# Patient Record
Sex: Male | Born: 1960 | Race: White | Hispanic: No | Marital: Married | Smoking: Former smoker
Health system: Southern US, Community
[De-identification: ages and names within clinical notes are randomized; demographics above are authoritative.]

## PROBLEM LIST (undated history)

## (undated) DIAGNOSIS — B1910 Unspecified viral hepatitis B without hepatic coma: Secondary | ICD-10-CM

## (undated) DIAGNOSIS — E785 Hyperlipidemia, unspecified: Secondary | ICD-10-CM

## (undated) DIAGNOSIS — R0602 Shortness of breath: Secondary | ICD-10-CM

## (undated) DIAGNOSIS — M25474 Effusion, right foot: Secondary | ICD-10-CM

## (undated) DIAGNOSIS — M25472 Effusion, left ankle: Secondary | ICD-10-CM

## (undated) DIAGNOSIS — H9190 Unspecified hearing loss, unspecified ear: Secondary | ICD-10-CM

## (undated) DIAGNOSIS — M549 Dorsalgia, unspecified: Secondary | ICD-10-CM

## (undated) DIAGNOSIS — E119 Type 2 diabetes mellitus without complications: Secondary | ICD-10-CM

## (undated) DIAGNOSIS — R1310 Dysphagia, unspecified: Secondary | ICD-10-CM

## (undated) DIAGNOSIS — I10 Essential (primary) hypertension: Secondary | ICD-10-CM

## (undated) DIAGNOSIS — M25475 Effusion, left foot: Secondary | ICD-10-CM

## (undated) DIAGNOSIS — I2510 Atherosclerotic heart disease of native coronary artery without angina pectoris: Secondary | ICD-10-CM

## (undated) DIAGNOSIS — R079 Chest pain, unspecified: Secondary | ICD-10-CM

## (undated) DIAGNOSIS — N529 Male erectile dysfunction, unspecified: Secondary | ICD-10-CM

## (undated) DIAGNOSIS — R5383 Other fatigue: Secondary | ICD-10-CM

## (undated) DIAGNOSIS — M25471 Effusion, right ankle: Secondary | ICD-10-CM

## (undated) HISTORY — DX: Dorsalgia, unspecified: M54.9

## (undated) HISTORY — DX: Dysphagia, unspecified: R13.10

## (undated) HISTORY — DX: Unspecified hearing loss, unspecified ear: H91.90

## (undated) HISTORY — DX: Effusion, left foot: M25.475

## (undated) HISTORY — DX: Chest pain, unspecified: R07.9

## (undated) HISTORY — DX: Effusion, right ankle: M25.471

## (undated) HISTORY — DX: Effusion, left ankle: M25.472

## (undated) HISTORY — DX: Essential (primary) hypertension: I10

## (undated) HISTORY — DX: Male erectile dysfunction, unspecified: N52.9

## (undated) HISTORY — DX: Other fatigue: R53.83

## (undated) HISTORY — DX: Shortness of breath: R06.02

## (undated) HISTORY — PX: VASECTOMY: SHX75

## (undated) HISTORY — DX: Effusion, right ankle: M25.474

---

## 1999-06-01 ENCOUNTER — Ambulatory Visit (HOSPITAL_COMMUNITY): Admission: RE | Admit: 1999-06-01 | Discharge: 1999-06-01 | Payer: Self-pay | Admitting: *Deleted

## 1999-06-01 ENCOUNTER — Encounter: Payer: Self-pay | Admitting: *Deleted

## 2000-11-23 ENCOUNTER — Ambulatory Visit (HOSPITAL_COMMUNITY): Admission: RE | Admit: 2000-11-23 | Discharge: 2000-11-23 | Payer: Self-pay | Admitting: Gastroenterology

## 2000-11-23 ENCOUNTER — Encounter: Payer: Self-pay | Admitting: Gastroenterology

## 2002-03-23 ENCOUNTER — Emergency Department (HOSPITAL_COMMUNITY): Admission: EM | Admit: 2002-03-23 | Discharge: 2002-03-23 | Payer: Self-pay | Admitting: *Deleted

## 2002-05-15 ENCOUNTER — Encounter: Admission: RE | Admit: 2002-05-15 | Discharge: 2002-08-13 | Payer: Self-pay | Admitting: Family Medicine

## 2003-06-12 ENCOUNTER — Ambulatory Visit (HOSPITAL_BASED_OUTPATIENT_CLINIC_OR_DEPARTMENT_OTHER): Admission: RE | Admit: 2003-06-12 | Discharge: 2003-06-12 | Payer: Self-pay | Admitting: Orthopedic Surgery

## 2003-06-12 ENCOUNTER — Ambulatory Visit (HOSPITAL_COMMUNITY): Admission: RE | Admit: 2003-06-12 | Discharge: 2003-06-12 | Payer: Self-pay | Admitting: Orthopedic Surgery

## 2003-08-23 HISTORY — PX: SHOULDER OPEN ROTATOR CUFF REPAIR: SHX2407

## 2003-12-24 ENCOUNTER — Emergency Department (HOSPITAL_COMMUNITY): Admission: EM | Admit: 2003-12-24 | Discharge: 2003-12-25 | Payer: Self-pay | Admitting: Emergency Medicine

## 2004-09-29 ENCOUNTER — Ambulatory Visit (HOSPITAL_COMMUNITY): Admission: RE | Admit: 2004-09-29 | Discharge: 2004-09-29 | Payer: Self-pay | Admitting: Gastroenterology

## 2011-01-24 ENCOUNTER — Ambulatory Visit
Admission: RE | Admit: 2011-01-24 | Discharge: 2011-01-24 | Disposition: A | Discharge status: Home / Self Care | Payer: 59 | Source: Ambulatory Visit | Attending: Family Medicine | Admitting: Family Medicine

## 2011-01-24 ENCOUNTER — Other Ambulatory Visit: Payer: Self-pay | Admitting: Family Medicine

## 2011-01-24 DIAGNOSIS — J189 Pneumonia, unspecified organism: Secondary | ICD-10-CM

## 2011-05-03 ENCOUNTER — Other Ambulatory Visit: Payer: Self-pay | Admitting: Family Medicine

## 2011-05-03 DIAGNOSIS — R911 Solitary pulmonary nodule: Secondary | ICD-10-CM

## 2011-05-23 ENCOUNTER — Other Ambulatory Visit: Payer: 59

## 2012-02-09 ENCOUNTER — Emergency Department (HOSPITAL_BASED_OUTPATIENT_CLINIC_OR_DEPARTMENT_OTHER)
Admission: EM | Admit: 2012-02-09 | Discharge: 2012-02-09 | Disposition: A | Discharge status: Home / Self Care | Payer: Managed Care, Other (non HMO) | Attending: Emergency Medicine | Admitting: Emergency Medicine

## 2012-02-09 ENCOUNTER — Encounter (HOSPITAL_BASED_OUTPATIENT_CLINIC_OR_DEPARTMENT_OTHER): Payer: Self-pay | Admitting: *Deleted

## 2012-02-09 ENCOUNTER — Emergency Department (HOSPITAL_BASED_OUTPATIENT_CLINIC_OR_DEPARTMENT_OTHER): Payer: Managed Care, Other (non HMO)

## 2012-02-09 DIAGNOSIS — Z794 Long term (current) use of insulin: Secondary | ICD-10-CM | POA: Insufficient documentation

## 2012-02-09 DIAGNOSIS — M109 Gout, unspecified: Secondary | ICD-10-CM | POA: Insufficient documentation

## 2012-02-09 DIAGNOSIS — E119 Type 2 diabetes mellitus without complications: Secondary | ICD-10-CM | POA: Insufficient documentation

## 2012-02-09 LAB — GLUCOSE, CAPILLARY: Glucose-Capillary: 470 mg/dL — ABNORMAL HIGH (ref 70–99)

## 2012-02-09 MED ORDER — HYDROCODONE-ACETAMINOPHEN 5-500 MG PO TABS: 1.0000 | ORAL_TABLET | Freq: Four times a day (QID) | ORAL | Status: AC | PRN

## 2012-02-09 MED ORDER — COLCHICINE 0.6 MG PO TABS: 0.6000 mg | ORAL_TABLET | Freq: Every day | ORAL | Status: DC

## 2012-02-09 MED ORDER — INDOMETHACIN 25 MG PO CAPS: 25.0000 mg | ORAL_CAPSULE | Freq: Three times a day (TID) | ORAL | Status: DC | PRN

## 2012-02-09 NOTE — ED Provider Notes (Addendum)
History     CSN: [id number]  Arrival date & time 02/09/12  6295   First MD Initiated Contact with Patient 02/09/12 1950      Chief Complaint  Patient presents with  . Toe Pain    (Consider location/radiation/quality/duration/timing/severity/associated sxs/prior treatment) Patient is a 51 y.o. male presenting with toe pain. The history is provided by the patient.  Toe Pain This is a new (woke up this am with right great toe pain which has only started worsening.) problem. The current episode started 6 to 12 hours ago. The problem occurs constantly. The problem has been gradually worsening. The symptoms are aggravated by walking and bending. The symptoms are relieved by relaxation. He has tried rest for the symptoms. The treatment provided moderate relief.    Past Medical History  Diagnosis Date  . Diabetes mellitus     Past Surgical History  Procedure Date  . Rotator cuff repair     No family history on file.  History  Substance Use Topics  . Smoking status: Never Smoker   . Smokeless tobacco: Not on file  . Alcohol Use: No      Review of Systems  All other systems reviewed and are negative.    Allergies  Review of patient's allergies indicates no known allergies.  Home Medications   Current Outpatient Rx  Name Route Sig Dispense Refill  . GEMFIBROZIL 600 MG PO TABS Oral Take 600 mg by mouth 2 (two) times daily.    . INSULIN DETEMIR 100 UNIT/ML [place] SOLN Subcutaneous Inject 10 Units into the skin every morning.    Marland Kitchen METFORMIN HCL 500 MG PO TABS Oral Take 500 mg by mouth 2 (two) times daily.    . ADULT MULTIVITAMIN W/MINERALS CH Oral Take 1 tablet by mouth daily.    Marland Kitchen SIMVASTATIN 40 MG PO TABS Oral Take 40 mg by mouth daily.      BP 153/87  Pulse 102  Temp 98.7 F (37.1 C) (Oral)  Resp 20  SpO2 98%  Physical Exam  Constitutional: He is oriented to person, place, and time. He appears well-developed and well-nourished. No distress.  HENT:  Head:  Normocephalic and atraumatic.  Musculoskeletal: He exhibits tenderness.       Right foot: He exhibits bony tenderness and swelling. He exhibits normal capillary refill and no deformity.       Feet:  Neurological: He is alert and oriented to person, place, and time. He has normal strength. No sensory deficit.  Skin: Skin is warm and dry.    ED Course  Procedures (including critical care time)  Labs Reviewed - No data to display Dg Toe Great Right  02/09/2012  *RADIOLOGY REPORT*  Clinical Data: Great toe pain and tenderness.  RIGHT GREAT TOE  Comparison: None.  Findings: No evidence of fracture or dislocation.  No evidence of osteolysis or periosteal reaction.  No evidence of arthropathy or other significant bone abnormality.  No evidence of radiopaque foreign body or soft tissue gas.  IMPRESSION: Negative.  Original Report Authenticated By: Danae Orleans, M.D.     1. Gout       MDM   Patient with symptoms most consistent for gout. He states he woke up this morning with pain in his right great toe which is only escalated throughout the day. On exam the patient has a warm, erythematous, tender right MTP and great toe. There is no sign of diabetic foot ulcers. He states he's never had any kidney problems he  had normal lab tests other than his blood sugar a month ago at his doctor's appointment. He denies any systemic symptoms or infectious etiology. Plain film is negative and he denies trauma. Patient treated for gout.        Fernando Sprout, MD 02/09/12 2018  Fernando Sprout, MD 02/09/12 2039

## 2012-02-09 NOTE — ED Notes (Signed)
Woke this am with right great toe pain. No known injury. Pt is a diabetic.

## 2012-02-09 NOTE — ED Notes (Signed)
Patient transported to X-ray 

## 2012-03-10 ENCOUNTER — Encounter: Payer: Self-pay | Admitting: Cardiovascular Disease

## 2012-03-10 ENCOUNTER — Encounter: Payer: Self-pay | Admitting: Nurse Practitioner

## 2012-03-22 ENCOUNTER — Encounter: Payer: Self-pay | Admitting: Cardiovascular Disease

## 2012-03-22 ENCOUNTER — Ambulatory Visit (INDEPENDENT_AMBULATORY_CARE_PROVIDER_SITE_OTHER): Payer: Managed Care, Other (non HMO) | Admitting: Cardiovascular Disease

## 2012-03-22 ENCOUNTER — Inpatient Hospital Stay (HOSPITAL_COMMUNITY)
Admission: AD | Admit: 2012-03-22 | Discharge: 2012-03-24 | DRG: 247 | Disposition: A | Discharge status: Home / Self Care | Payer: Managed Care, Other (non HMO) | Source: Ambulatory Visit | Attending: Cardiovascular Disease | Admitting: Cardiovascular Disease

## 2012-03-22 VITALS — BP 131/83 | HR 100 | Ht 67.0 in | Wt 206.4 lb

## 2012-03-22 DIAGNOSIS — E785 Hyperlipidemia, unspecified: Secondary | ICD-10-CM | POA: Insufficient documentation

## 2012-03-22 DIAGNOSIS — I200 Unstable angina: Secondary | ICD-10-CM

## 2012-03-22 DIAGNOSIS — J45909 Unspecified asthma, uncomplicated: Secondary | ICD-10-CM | POA: Diagnosis present

## 2012-03-22 DIAGNOSIS — IMO0001 Reserved for inherently not codable concepts without codable children: Secondary | ICD-10-CM | POA: Diagnosis present

## 2012-03-22 DIAGNOSIS — Z794 Long term (current) use of insulin: Secondary | ICD-10-CM

## 2012-03-22 DIAGNOSIS — I2510 Atherosclerotic heart disease of native coronary artery without angina pectoris: Secondary | ICD-10-CM | POA: Insufficient documentation

## 2012-03-22 DIAGNOSIS — E781 Pure hyperglyceridemia: Secondary | ICD-10-CM | POA: Diagnosis present

## 2012-03-22 DIAGNOSIS — E119 Type 2 diabetes mellitus without complications: Secondary | ICD-10-CM

## 2012-03-22 DIAGNOSIS — R000 Tachycardia, unspecified: Secondary | ICD-10-CM | POA: Diagnosis present

## 2012-03-22 DIAGNOSIS — Z79899 Other long term (current) drug therapy: Secondary | ICD-10-CM

## 2012-03-22 HISTORY — DX: Unspecified viral hepatitis B without hepatic coma: B19.10

## 2012-03-22 HISTORY — DX: Atherosclerotic heart disease of native coronary artery without angina pectoris: I25.10

## 2012-03-22 HISTORY — DX: Type 2 diabetes mellitus without complications: E11.9

## 2012-03-22 HISTORY — DX: Hyperlipidemia, unspecified: E78.5

## 2012-03-22 LAB — COMPREHENSIVE METABOLIC PANEL
ALT: 17 U/L (ref 0–53)
AST: 16 U/L (ref 0–37)
CO2: 23 mEq/L (ref 19–32)
Chloride: 100 mEq/L (ref 96–112)
GFR calc non Af Amer: >90 mL/min (ref >90)
Sodium: 137 mEq/L (ref 135–145)
Total Bilirubin: 0.2 mg/dL — ABNORMAL LOW (ref 0.3–1.2)

## 2012-03-22 LAB — CBC WITH DIFFERENTIAL/PLATELET
Lymphocytes Relative: 52 % — ABNORMAL HIGH (ref 12–46)
Lymphs Abs: 5.8 10*3/uL — ABNORMAL HIGH (ref 0.7–4.0)
MCV: 84.5 fL (ref 78.0–100.0)
Neutrophils Relative %: 41 % — ABNORMAL LOW (ref 43–77)
Platelets: 257 10*3/uL (ref 150–400)
RBC: 5.34 MIL/uL (ref 4.22–5.81)
WBC: 11.2 10*3/uL — ABNORMAL HIGH (ref 4.0–10.5)

## 2012-03-22 LAB — CARDIAC PANEL(CRET KIN+CKTOT+MB+TROPI): CK, MB: 1.3 ng/mL (ref 0.3–4.0)

## 2012-03-22 LAB — PROTIME-INR: Prothrombin Time: 13.5 seconds (ref 11.6–15.2)

## 2012-03-22 LAB — MRSA PCR SCREENING: MRSA by PCR: NEGATIVE

## 2012-03-22 MED ORDER — ONDANSETRON HCL 4 MG/2ML IJ SOLN: 4.0000 mg | Freq: Four times a day (QID) | INTRAMUSCULAR | Status: DC | PRN

## 2012-03-22 MED ORDER — METOPROLOL TARTRATE 25 MG PO TABS
25.0000 mg | ORAL_TABLET | Freq: Two times a day (BID) | ORAL | Status: DC
  Administered 2012-03-22 – 2012-03-24 (×3): 25 mg via ORAL
  Filled 2012-03-22 (×5): qty 1

## 2012-03-22 MED ORDER — INSULIN DETEMIR 100 UNIT/ML ~~LOC~~ SOLN
26.0000 [IU] | Freq: Every day | SUBCUTANEOUS | Status: DC
  Filled 2012-03-22: qty 10

## 2012-03-22 MED ORDER — INSULIN NPH (HUMAN) (ISOPHANE) 100 UNIT/ML ~~LOC~~ SUSP
10.0000 [IU] | Freq: Two times a day (BID) | SUBCUTANEOUS | Status: DC
  Filled 2012-03-22: qty 10

## 2012-03-22 MED ORDER — SUCRALFATE 1 GM/10ML PO SUSP
1.0000 g | Freq: Four times a day (QID) | ORAL | Status: DC
  Filled 2012-03-22 (×2): qty 10

## 2012-03-22 MED ORDER — INDOMETHACIN 25 MG PO CAPS
25.0000 mg | ORAL_CAPSULE | Freq: Three times a day (TID) | ORAL | Status: DC | PRN
  Filled 2012-03-22: qty 1

## 2012-03-22 MED ORDER — ATORVASTATIN CALCIUM 80 MG PO TABS
80.0000 mg | ORAL_TABLET | Freq: Every day | ORAL | Status: DC
  Administered 2012-03-22 – 2012-03-24 (×2): 80 mg via ORAL
  Filled 2012-03-22 (×3): qty 1

## 2012-03-22 MED ORDER — COLCHICINE 0.6 MG PO TABS: 0.6000 mg | ORAL_TABLET | Freq: Every day | ORAL | Status: DC

## 2012-03-22 MED ORDER — ACETAMINOPHEN 325 MG PO TABS: 650.0000 mg | ORAL_TABLET | ORAL | Status: DC | PRN

## 2012-03-22 MED ORDER — SODIUM CHLORIDE 0.9 % IJ SOLN
3.0000 mL | Freq: Two times a day (BID) | INTRAMUSCULAR | Status: DC
  Administered 2012-03-22: 3 mL via INTRAVENOUS
  Administered 2012-03-23: 09:00:00 via INTRAVENOUS

## 2012-03-22 MED ORDER — INSULIN ASPART 100 UNIT/ML ~~LOC~~ SOLN
0.0000 [IU] | Freq: Three times a day (TID) | SUBCUTANEOUS | Status: DC
  Administered 2012-03-23: 18:00:00 3 [IU] via SUBCUTANEOUS
  Administered 2012-03-24: 11 [IU] via SUBCUTANEOUS

## 2012-03-22 MED ORDER — SODIUM CHLORIDE 0.9 % IV SOLN: 1.0000 mL/kg/h | INTRAVENOUS | Status: DC

## 2012-03-22 MED ORDER — ASPIRIN 81 MG PO CHEW
324.0000 mg | CHEWABLE_TABLET | ORAL | Status: AC
  Administered 2012-03-23: 324 mg via ORAL
  Filled 2012-03-22: qty 4

## 2012-03-22 MED ORDER — DIAZEPAM 5 MG PO TABS
5.0000 mg | ORAL_TABLET | ORAL | Status: AC
  Administered 2012-03-23: 5 mg via ORAL
  Filled 2012-03-22: qty 1

## 2012-03-22 MED ORDER — HEPARIN BOLUS VIA INFUSION
4000.0000 [IU] | Freq: Once | INTRAVENOUS | Status: AC
  Administered 2012-03-22: 4000 [IU] via INTRAVENOUS
  Filled 2012-03-22: qty 4000

## 2012-03-22 MED ORDER — NITROGLYCERIN 0.4 MG SL SUBL: 0.4000 mg | SUBLINGUAL_TABLET | SUBLINGUAL | Status: DC | PRN

## 2012-03-22 MED ORDER — INSULIN ASPART 100 UNIT/ML ~~LOC~~ SOLN
0.0000 [IU] | Freq: Every day | SUBCUTANEOUS | Status: DC
  Administered 2012-03-22: 2 [IU] via SUBCUTANEOUS

## 2012-03-22 MED ORDER — SODIUM CHLORIDE 0.9 % IJ SOLN: 3.0000 mL | INTRAMUSCULAR | Status: DC | PRN

## 2012-03-22 MED ORDER — DIAZEPAM 5 MG PO TABS: 5.0000 mg | ORAL_TABLET | Freq: Four times a day (QID) | ORAL | Status: DC | PRN

## 2012-03-22 MED ORDER — NITROGLYCERIN IN D5W 200-5 MCG/ML-% IV SOLN
3.0000 ug/min | INTRAVENOUS | Status: DC
  Administered 2012-03-22: 5 ug/min via INTRAVENOUS
  Filled 2012-03-22: qty 250

## 2012-03-22 MED ORDER — ADULT MULTIVITAMIN W/MINERALS CH
1.0000 | ORAL_TABLET | Freq: Every day | ORAL | Status: DC
  Administered 2012-03-23 – 2012-03-24 (×2): 1 via ORAL
  Filled 2012-03-22 (×2): qty 1

## 2012-03-22 MED ORDER — SODIUM CHLORIDE 0.9 % IV SOLN: 250.0000 mL | INTRAVENOUS | Status: DC | PRN

## 2012-03-22 MED ORDER — HYDROCODONE-ACETAMINOPHEN 5-500 MG PO TABS: 1.0000 | ORAL_TABLET | Freq: Four times a day (QID) | ORAL | Status: DC | PRN

## 2012-03-22 MED ORDER — HYDROCODONE-ACETAMINOPHEN 5-325 MG PO TABS: 1.0000 | ORAL_TABLET | Freq: Four times a day (QID) | ORAL | Status: DC | PRN

## 2012-03-22 MED ORDER — ASPIRIN 81 MG PO CHEW
324.0000 mg | CHEWABLE_TABLET | ORAL | Status: AC
  Administered 2012-03-22: 324 mg via ORAL
  Filled 2012-03-22: qty 4

## 2012-03-22 MED ORDER — INSULIN GLARGINE 100 UNIT/ML ~~LOC~~ SOLN
13.0000 [IU] | Freq: Once | SUBCUTANEOUS | Status: AC
  Administered 2012-03-22: 13 [IU] via SUBCUTANEOUS

## 2012-03-22 MED ORDER — SODIUM CHLORIDE 0.9 % IV SOLN
INTRAVENOUS | Status: DC
  Administered 2012-03-22: 18:00:00 via INTRAVENOUS

## 2012-03-22 MED ORDER — ASPIRIN EC 81 MG PO TBEC: 81.0000 mg | DELAYED_RELEASE_TABLET | Freq: Every day | ORAL | Status: DC

## 2012-03-22 MED ORDER — ASPIRIN EC 81 MG PO TBEC
81.0000 mg | DELAYED_RELEASE_TABLET | Freq: Every day | ORAL | Status: DC
  Administered 2012-03-24: 81 mg via ORAL
  Filled 2012-03-22: qty 1

## 2012-03-22 MED ORDER — ASPIRIN 300 MG RE SUPP
300.0000 mg | RECTAL | Status: AC
  Filled 2012-03-22: qty 1

## 2012-03-22 MED ORDER — HEPARIN (PORCINE) IN NACL 100-0.45 UNIT/ML-% IJ SOLN
1550.0000 [IU]/h | INTRAMUSCULAR | Status: DC
  Administered 2012-03-22: 1200 [IU]/h via INTRAVENOUS
  Administered 2012-03-23 (×2): 1550 [IU]/h via INTRAVENOUS
  Filled 2012-03-22 (×3): qty 250

## 2012-03-22 MED ORDER — FENOFIBRATE 160 MG PO TABS
160.0000 mg | ORAL_TABLET | Freq: Every day | ORAL | Status: DC
  Administered 2012-03-22 – 2012-03-24 (×3): 160 mg via ORAL
  Filled 2012-03-22 (×3): qty 1

## 2012-03-22 NOTE — Progress Notes (Signed)
Fernando Nelson Date of Birth  December 15, 1960       [facility]    [place] [address]  [address] [place], [place]  [address]   [place], [place]  [address] [phone]     [phone]   Fax  [phone]    Fax [phone]  Problem List: 1. Chest pain 2. Diabetes Mellitus 3. Hyperlipidemia / hypertriglyceridemia   History of Present Illness:  Fernando Nelson is a 51 yo with a recent episode of CP.  He was admitted to [facility] with CP and markedly elevated glucose levels and triglyceride levels.  He ruled out for MI.  No stress testing was done.  Since that time 03/09/12 He describes a recurrent  chest heaviness - primarily in the center of his chest with radiation to his jaw and down his left arm.  These last 5-10 minutes.  These pains are heavy.   He was well / pain free up until he presented to the hospital on 719. Since that time he's had recurrent episodes of this chest heaviness. The pains are rate related from a 3/10 to as much as an 8/10. Occur with rest and with exertion. He's continued to have these severe pains.     Current Outpatient Prescriptions on File Prior to Visit  Medication Sig Dispense Refill  . colchicine 0.6 MG tablet Take 1 tablet (0.6 mg total) by mouth daily.  10 tablet  0  . gemfibrozil (LOPID) 600 MG tablet Take 600 mg by mouth 2 (two) times daily.      . indomethacin (INDOCIN) 25 MG capsule Take 1 capsule (25 mg total) by mouth 3 (three) times daily as needed.  21 capsule  0  . insulin detemir (LEVEMIR FLEXPEN) 100 UNIT/ML injection Inject 26 Units into the skin daily.       . Insulin Isophane Human (NOVOLIN N [place]) Inject 3 Units into the skin.      . metFORMIN (GLUCOPHAGE) 500 MG tablet Take 1,000 mg by mouth 2 (two) times daily.       . Multiple Vitamin (MULTIVITAMIN WITH MINERALS) TABS Take 1 tablet by mouth daily.      . simvastatin (ZOCOR) 40 MG tablet Take 40 mg by mouth daily.      . sucralfate (CARAFATE)  1 GM/10ML suspension Take 1 g by mouth 4 (four) times daily.        No Known Allergies  Past Medical History  Diagnosis Date  . Diabetes mellitus     Past Surgical History  Procedure Date  . Rotator cuff repair     History  Smoking status  . Never Smoker   Smokeless tobacco  . Not on file    History  Alcohol Use No    Family History  Problem Relation Age of Onset  . Heart failure Mother     Reviw of Systems:  Reviewed in the HPI.  All other systems are negative.  Physical Exam: Blood pressure 131/83, pulse 100, height 5\' 7"  (1.702 m), weight 206 lb 6.4 oz (93.622 kg). General: Well developed, well nourished, in no acute distress.  Head: Normocephalic, atraumatic, sclera non-icteric, mucus membranes are moist,   Neck: Supple. Carotids are 2 + without bruits. No JVD  Lungs: Clear bilaterally to auscultation.  Heart: regular rate.  normal  S1 S2. No murmurs, gallops or rubs.  Abdomen: Soft, non-tender, non-distended with normal bowel sounds. No hepatomegaly. No rebound/guarding. No masses.  Msk:  Strength and tone are normal  Extremities: No clubbing or cyanosis. No edema.  Distal pedal pulses are 2+ and equal bilaterally.  Neuro: Alert and oriented X 3. Moves all extremities spontaneously.  Psych:  Responds to questions appropriately with a normal affect.  ECG: March 22, 2012 - Sinus tachycardia at 101.  ST depression /T wave inversion inferiorly with T wave flattening in the lateral leads.  These ST segment depressions and T-wave inversions are new from his EKG last week at [facility].  Assessment / Plan:

## 2012-03-22 NOTE — Assessment & Plan Note (Signed)
Fernando Nelson presents with symptoms that are consistent with unstable angina. He was completely pain-free until July 19. He was admitted to the hospital at [facility]. He ruled out for myocardial infarction. He had very minimal ST segment elevation there. He's had recurrent episodes of chest pain since that time. He has up to 12 episodes a day. These are described as a heavy chest pressure with radiation up into his jaw and down into his left arm.   These Occur with rest and exertion.  Because of his ongoing symptoms of rest angina and his EKG that is suggestive of inferior wall ischemia I think that he needs to be admitted to the hospital. [facility] admit him to a step down bed and start him on IV heparin and IV nitroglycerin.  He has numerous risk factors for coronary artery disease including poorly controlled diabetes mellitus and poorly controlled hypertriglyceridemia.

## 2012-03-22 NOTE — Patient Instructions (Addendum)
GO TO ADMITTING AT MAIN ENTRANCE OF [facility]   ROOM WILL BE ON 2900

## 2012-03-22 NOTE — Progress Notes (Signed)
Pt is feeling better - currently in 2917.  Just got the heparin and IV NTG started.    Changed his diet to carb modified. He is still tachycardic.will add metoprolol 25 mg BID.  For left heart cath with possible PCI in AM.  Alvia Grove., MD, [facility] 03/22/2012, 7:38 PM Office - [phone] Pager [phone]

## 2012-03-22 NOTE — Assessment & Plan Note (Signed)
His glucose levels have been very elevated. In the progressive control of his diabetes.

## 2012-03-22 NOTE — Progress Notes (Addendum)
ANTICOAGULATION CONSULT NOTE - Initial Consult  Pharmacy Consult for Heparin Indication: unstable angina  No Known Allergies  Patient Measurements: Height: 5\' 7"  (170.2 cm) Weight: 206 lb 6.4 oz (93.622 kg) IBW/kg (Calculated) : 66.1  Heparin Dosing Weight: 86 kg  Vital Signs: Temp: 99.2 F (37.3 C) (08/01 1727) Temp src: Oral (08/01 1727) BP: 131/83 mmHg (08/01 1557) Pulse Rate: 100  (08/01 1557)  Labs: No results found for this basename: HGB:2,HCT:3,PLT:3,APTT:3,LABPROT:3,INR:3,HEPARINUNFRC:3,CREATININE:3,CKTOTAL:3,CKMB:3,TROPONINI:3 in the last 72 hours  CrCl is unknown because no creatinine reading has been taken.   Medical History: Past Medical History  Diagnosis Date  . Diabetes mellitus     Medications:  Prescriptions prior to admission  Medication Sig Dispense Refill  . colchicine 0.6 MG tablet Take 1 tablet (0.6 mg total) by mouth daily.  10 tablet  0  . diazepam (VALIUM) 5 MG tablet Take 5 mg by mouth every 6 (six) hours as needed.      Marland Kitchen gemfibrozil (LOPID) 600 MG tablet Take 600 mg by mouth 2 (two) times daily.      . Hydrocodone-Acetaminophen (VICODIN PO) Take 500 mg by mouth.      . indomethacin (INDOCIN) 25 MG capsule Take 1 capsule (25 mg total) by mouth 3 (three) times daily as needed.  21 capsule  0  . insulin detemir (LEVEMIR FLEXPEN) 100 UNIT/ML injection Inject 26 Units into the skin daily.       . Insulin Isophane Human (NOVOLIN N [place]) Inject 3 Units into the skin.      . metFORMIN (GLUCOPHAGE) 500 MG tablet Take 1,000 mg by mouth 2 (two) times daily.       . Multiple Vitamin (MULTIVITAMIN WITH MINERALS) TABS Take 1 tablet by mouth daily.      . simvastatin (ZOCOR) 40 MG tablet Take 40 mg by mouth daily.      . sucralfate (CARAFATE) 1 GM/10ML suspension Take 1 g by mouth 4 (four) times daily.        Assessment: 51 yo male admitted with chest pain concerning for unstable angina. Patient has had chest pain since admission to [facility] on 7/19 where  he ruled out for MI. Pharmacy consulted to begin heparin.  Labs have been drawn but are in process. I spoke with Mr. Swaim and he reports no history of bleeding or kidney disease.  Goal of Therapy:  Heparin level 0.3-0.7 units/ml Monitor platelets by anticoagulation protocol: Yes   Plan:  -Heparin 4000 units IV bolus then infuse at 1200 units/hr -Heparin level 6 hours after started -Daily heparin level and CBC while on heparin -Follow-up baseline labs   [facility], [place].D., BCPS Clinical Pharmacist Pager: [phone] 03/22/2012 5:43 PM   Addendum: Baseline labs including CBC and SCr are wnl. First troponin is negative.  [place], [address].D., BCPS Clinical Pharmacist Pager: [phone] 03/22/2012 7:08 PM

## 2012-03-22 NOTE — Assessment & Plan Note (Signed)
His triglyceride levels have been over thousand for several years. We will need to discontinue the Lopid and start him on high lipids. In addition I never give Lopid in combination with simvastatin.  We talked about a ultra-low-fat diet and the role of exercise and this.

## 2012-03-22 NOTE — H&P (Signed)
Elyn Aquas., MD 03/22/2012 5:32 PM Signed  [name]  Date of Birth 1961-03-26  [facility] [place]  [address], [address]  [place], [place] [address] [place], [place] [address]  [phone]  Fax [phone] Fax [phone]  Problem List:  1. Chest pain  2. Diabetes Mellitus  3. Hyperlipidemia / hypertriglyceridemia  History of Present Illness:  Fernando Nelson is a 51 yo with a recent episode of CP. He was admitted to [facility] with CP and markedly elevated glucose levels and triglyceride levels. He ruled out for MI. No stress testing was done.  Since that time 03/09/12 He describes a recurrent chest heaviness - primarily in the center of his chest with radiation to his jaw and down his left arm. These last 5-10 minutes. These pains are heavy.  He was well / pain free up until he presented to the hospital on 719. Since that time he's had recurrent episodes of this chest heaviness. The pains are rate related from a 3/10 to as much as an 8/10. Occur with rest and with exertion. He's continued to have these severe pains.  Current Outpatient Prescriptions on File Prior to Visit   Medication  Sig  Dispense  Refill   .  colchicine 0.6 MG tablet  Take 1 tablet (0.6 mg total) by mouth daily.  10 tablet  0   .  gemfibrozil (LOPID) 600 MG tablet  Take 600 mg by mouth 2 (two) times daily.     .  indomethacin (INDOCIN) 25 MG capsule  Take 1 capsule (25 mg total) by mouth 3 (three) times daily as needed.  21 capsule  0   .  insulin detemir (LEVEMIR FLEXPEN) 100 UNIT/ML injection  Inject 26 Units into the skin daily.     .  Insulin Isophane Human (NOVOLIN N [place])  Inject 3 Units into the skin.     .  metFORMIN (GLUCOPHAGE) 500 MG tablet  Take 1,000 mg by mouth 2 (two) times daily.     .  Multiple Vitamin (MULTIVITAMIN WITH MINERALS) TABS  Take 1 tablet by mouth daily.     .  simvastatin (ZOCOR) 40 MG tablet  Take 40 mg by mouth daily.     .   sucralfate (CARAFATE) 1 GM/10ML suspension  Take 1 g by mouth 4 (four) times daily.      No Known Allergies  Past Medical History   Diagnosis  Date   .  Diabetes mellitus     Past Surgical History   Procedure  Date   .  Rotator cuff repair     History   Smoking status   .  Never Smoker   Smokeless tobacco   .  Not on file    History   Alcohol Use  No    Family History   Problem  Relation  Age of Onset   .  Heart failure  Mother     Reviw of Systems:  Reviewed in the HPI. All other systems are negative.  Physical Exam:  Blood pressure 131/83, pulse 100, height 5\' 7"  (1.702 m), weight 206 lb 6.4 oz (93.622 kg).  General: Well developed, well nourished, in no acute distress.  Head: Normocephalic, atraumatic, sclera non-icteric, mucus membranes are moist,  Neck: Supple. Carotids are 2 + without bruits. No JVD  Lungs: Clear bilaterally to auscultation.  Heart: regular rate. normal S1 S2. No murmurs, gallops or rubs.  Abdomen: Soft, non-tender,  non-distended with normal bowel sounds. No hepatomegaly. No rebound/guarding. No masses.  Msk: Strength and tone are normal  Extremities: No clubbing or cyanosis. No edema. Distal pedal pulses are 2+ and equal bilaterally.  Neuro: Alert and oriented X 3. Moves all extremities spontaneously.  Psych: Responds to questions appropriately with a normal affect.  ECG:  March 22, 2012 - Sinus tachycardia at 101. ST depression /T wave inversion inferiorly with T wave flattening in the lateral leads. These ST segment depressions and T-wave inversions are new from his EKG last week at [facility].  Assessment / Plan:   Unstable angina - Elyn Aquas., MD 03/22/2012 4:35 PM Signed  Fernando Nelson presents with symptoms that are consistent with unstable angina. He was completely pain-free until July 19. He was admitted to the hospital at [facility]. He ruled out for myocardial infarction. He had very minimal ST segment elevation there. He's  had recurrent episodes of chest pain since that time. He has up to 12 episodes a day. These are described as a heavy chest pressure with radiation up into his jaw and down into his left arm.  These Occur with rest and exertion. Because of his ongoing symptoms of rest angina and his EKG that is suggestive of inferior wall ischemia I think that he needs to be admitted to the hospital. [facility] admit him to a step down bed and start him on IV heparin and IV nitroglycerin.  He has numerous risk factors for coronary artery disease including poorly controlled diabetes mellitus and poorly controlled hypertriglyceridemia. Hyperlipidemia - Elyn Aquas., MD 03/22/2012 4:36 PM Signed  His triglyceride levels have been over thousand for several years. We will need to discontinue the Lopid and start him on high lipids. In addition I never give Lopid in combination with simvastatin.  We talked about a ultra-low-fat diet and the role of exercise and this. Diabetes mellitus - Elyn Aquas., MD 03/22/2012 4:36 PM Signed  His glucose levels have been very elevated. In the progressive control of his diabetes.

## 2012-03-23 ENCOUNTER — Encounter (HOSPITAL_COMMUNITY): Payer: Self-pay | Admitting: *Deleted

## 2012-03-23 ENCOUNTER — Encounter (HOSPITAL_COMMUNITY)
Admission: AD | Disposition: A | Discharge status: Home / Self Care | Payer: Self-pay | Source: Ambulatory Visit | Attending: Cardiovascular Disease

## 2012-03-23 DIAGNOSIS — I2510 Atherosclerotic heart disease of native coronary artery without angina pectoris: Secondary | ICD-10-CM

## 2012-03-23 HISTORY — PX: PERCUTANEOUS CORONARY STENT INTERVENTION (PCI-S): SHX5485

## 2012-03-23 HISTORY — PX: LEFT HEART CATHETERIZATION WITH CORONARY ANGIOGRAM: SHX5451

## 2012-03-23 HISTORY — PX: CORONARY ANGIOPLASTY WITH STENT PLACEMENT: SHX49

## 2012-03-23 LAB — POCT ACTIVATED CLOTTING TIME: Activated Clotting Time: 334 seconds

## 2012-03-23 LAB — LIPID PANEL
HDL: 28 mg/dL — ABNORMAL LOW (ref >39)
LDL Cholesterol: 22 mg/dL (ref 0–99)
Total CHOL/HDL Ratio: 4.3 RATIO

## 2012-03-23 LAB — CBC
MCH: 28.5 pg (ref 26.0–34.0)
MCV: 84.9 fL (ref 78.0–100.0)
Platelets: 242 10*3/uL (ref 150–400)
RDW: 13.4 % (ref 11.5–15.5)

## 2012-03-23 LAB — CK TOTAL AND CKMB (NOT AT ARMC): Relative Index: INVALID (ref 0.0–2.5)

## 2012-03-23 LAB — TSH: TSH: 1.649 u[IU]/mL (ref 0.350–4.500)

## 2012-03-23 LAB — GLUCOSE, CAPILLARY
Glucose-Capillary: 202 mg/dL — ABNORMAL HIGH (ref 70–99)
Glucose-Capillary: 159 mg/dL — ABNORMAL HIGH (ref 70–99)
Glucose-Capillary: 157 mg/dL — ABNORMAL HIGH (ref 70–99)

## 2012-03-23 LAB — CARDIAC PANEL(CRET KIN+CKTOT+MB+TROPI)
CK, MB: 1.3 ng/mL (ref 0.3–4.0)
Total CK: 49 U/L (ref 7–232)
Total CK: 41 U/L (ref 7–232)
Troponin I: <0.3 ng/mL (ref <0.30)

## 2012-03-23 SURGERY — PERCUTANEOUS CORONARY STENT INTERVENTION (PCI-S)

## 2012-03-23 MED ORDER — BIVALIRUDIN 250 MG IV SOLR
INTRAVENOUS | Status: AC
  Filled 2012-03-23: qty 250

## 2012-03-23 MED ORDER — FENTANYL CITRATE 0.05 MG/ML IJ SOLN
INTRAMUSCULAR | Status: AC
  Administered 2012-03-23: 50 ug via INTRAVENOUS
  Filled 2012-03-23: qty 2

## 2012-03-23 MED ORDER — FENTANYL CITRATE 0.05 MG/ML IJ SOLN
50.0000 ug | Freq: Once | INTRAMUSCULAR | Status: AC
  Administered 2012-03-23: 50 ug via INTRAVENOUS

## 2012-03-23 MED ORDER — METOPROLOL TARTRATE 1 MG/ML IV SOLN
INTRAVENOUS | Status: AC
  Filled 2012-03-23: qty 5

## 2012-03-23 MED ORDER — ACETAMINOPHEN 325 MG PO TABS: 650.0000 mg | ORAL_TABLET | ORAL | Status: DC | PRN

## 2012-03-23 MED ORDER — SODIUM CHLORIDE 0.9 % IV SOLN
INTRAVENOUS | Status: AC
  Administered 2012-03-23: 17:00:00 via INTRAVENOUS

## 2012-03-23 MED ORDER — HEPARIN (PORCINE) IN NACL 2-0.9 UNIT/ML-% IJ SOLN
INTRAMUSCULAR | Status: AC
  Filled 2012-03-23: qty 1000

## 2012-03-23 MED ORDER — PRASUGREL HCL 10 MG PO TABS
10.0000 mg | ORAL_TABLET | Freq: Every day | ORAL | Status: DC
  Administered 2012-03-24: 10 mg via ORAL
  Filled 2012-03-23: qty 1

## 2012-03-23 MED ORDER — MIDAZOLAM HCL 2 MG/2ML IJ SOLN
INTRAMUSCULAR | Status: AC
  Filled 2012-03-23: qty 2

## 2012-03-23 MED ORDER — ONDANSETRON HCL 4 MG/2ML IJ SOLN: 4.0000 mg | Freq: Four times a day (QID) | INTRAMUSCULAR | Status: DC | PRN

## 2012-03-23 MED ORDER — LIDOCAINE HCL (PF) 1 % IJ SOLN
INTRAMUSCULAR | Status: AC
  Filled 2012-03-23: qty 30

## 2012-03-23 MED ORDER — HEPARIN (PORCINE) IN NACL 2-0.9 UNIT/ML-% IJ SOLN
INTRAMUSCULAR | Status: AC
  Filled 2012-03-23: qty 2000

## 2012-03-23 MED ORDER — VERAPAMIL HCL 2.5 MG/ML IV SOLN
INTRAVENOUS | Status: AC
  Filled 2012-03-23: qty 2

## 2012-03-23 MED ORDER — PRASUGREL HCL 10 MG PO TABS
ORAL_TABLET | ORAL | Status: AC
  Administered 2012-03-23: 60 mg via ORAL
  Filled 2012-03-23: qty 6

## 2012-03-23 MED ORDER — FENTANYL CITRATE 0.05 MG/ML IJ SOLN
INTRAMUSCULAR | Status: AC
  Filled 2012-03-23: qty 2

## 2012-03-23 MED ORDER — NITROGLYCERIN 0.2 MG/ML ON CALL CATH LAB
INTRAVENOUS | Status: AC
  Filled 2012-03-23: qty 1

## 2012-03-23 MED ORDER — PRASUGREL HCL 10 MG PO TABS
60.0000 mg | ORAL_TABLET | Freq: Once | ORAL | Status: AC
  Administered 2012-03-23: 60 mg via ORAL

## 2012-03-23 MED ORDER — HEPARIN BOLUS VIA INFUSION
3000.0000 [IU] | Freq: Once | INTRAVENOUS | Status: AC
  Administered 2012-03-23: 3000 [IU] via INTRAVENOUS
  Filled 2012-03-23: qty 3000

## 2012-03-23 MED ORDER — LIVING WELL WITH DIABETES BOOK
Freq: Once | Status: DC
  Filled 2012-03-23: qty 1

## 2012-03-23 MED ORDER — HEPARIN SODIUM (PORCINE) 1000 UNIT/ML IJ SOLN
INTRAMUSCULAR | Status: AC
  Filled 2012-03-23: qty 1

## 2012-03-23 NOTE — Progress Notes (Signed)
TR BAND REMOVAL  LOCATION:    right radial  DEFLATED PER PROTOCOL:    yes  TIME BAND OFF / DRESSING APPLIED:    19:30   SITE UPON ARRIVAL:    Level 0  SITE AFTER BAND REMOVAL:    Level 0  REVERSE ALLEN'S TEST:     positive  CIRCULATION SENSATION AND MOVEMENT:    Within Normal Limits   yes  COMMENTS:   Pt tolerated procedure well. CMS wnl

## 2012-03-23 NOTE — CV Procedure (Signed)
   CARDIAC CATH NOTE  Name: Fernando Nelson MRN: [id number] DOB: 02/02/1961  Procedure: PTCA and stenting of the RCA  Indication: unstable angina  Procedural Details: The right wrist was prepped, draped, and anesthetized with 1% lidocaine. Using a double glove technique, the indwelling 5 Fr sheath was exchanged for a 80F sheath into the radial artery. 3 mg verapamil was administered through the radial sheath. Weight-based bivalirudin was given for anticoagulation. Once a therapeutic ACT was achieved, a 6 [place] JR4 guide catheter was inserted.  A prowater coronary guidewire was used to cross the lesion.  The lesion was predilated with a 2.35mm balloon.  The lesion was then stented with a 3.50mm EVOLVE II study stent.  The stent was postdilated with a 3.75 noncompliant balloon.  Following PCI, there was 0% residual stenosis and TIMI-3 flow. Final angiography confirmed an excellent result. The patient tolerated the procedure well. There were no immediate procedural complications. A TR band was used for radial hemostasis. The patient was transferred to the post catheterization recovery area for further monitoring.  Lesion Data: Vessel: RCA Percent stenosis (pre): 95% TIMI-flow (pre):  3 Stent:  3.5 EVOLVE II study stent by 20mm Percent stenosis (post): 0 TIMI-flow (post): 3  Conclusions:  1.  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  DAPT  Shawnie Pons 03/23/2012, 4:21 PM

## 2012-03-23 NOTE — Progress Notes (Signed)
Patient is stable post PCI. Patient is enrolled in the EVOLVE II study and I reviewed that with him. (Promus Element vs Synergy) He will need DAPT for one year.

## 2012-03-23 NOTE — Interval H&P Note (Signed)
History and Physical Interval Note:  03/23/2012 10:58 AM  Fernando Nelson  has presented today for surgery, with the diagnosis of [place] The various methods of treatment have been discussed with the patient and family. After consideration of risks, benefits and other options for treatment, the patient has consented to  Procedure(s) (LRB): LEFT HEART CATHETERIZATION WITH CORONARY ANGIOGRAM (N/A) as a surgical intervention .  The patient's history has been reviewed, patient examined, no change in status, stable for surgery.  I have reviewed the patient's chart and labs.  Questions were answered to the patient's satisfaction.     Fernando Nelson

## 2012-03-23 NOTE — Progress Notes (Signed)
ANTICOAGULATION CONSULT NOTE - Follow Up Consult  Pharmacy Consult for heparin Indication: USAP  Labs:  Basename 03/23/12 0910 03/23/12 0525 03/23/12 0025 03/23/12 0024 03/22/12 1805  HGB -- 13.8 -- -- 15.4  HCT -- 41.1 -- -- 45.1  PLT -- 242 -- -- 257  APTT -- -- -- -- 30  LABPROT -- -- -- -- 13.5  INR -- -- -- -- 1.01  HEPARINUNFRC 0.60 -- <0.10* -- --  CREATININE -- -- -- -- 0.68  CKTOTAL -- 41 -- 49 49  CKMB -- 1.3 -- 1.3 1.3  TROPONINI -- <0.30 -- <0.30 <0.30    Assessment: 51yo male  on heparin for USAP and noted at goal (HL=0.6). Patient noted for cath today  Goal of Therapy:  Heparin level 0.3-0.7 units/ml   Plan:  -No heparin changes needed -Will follow post cath  Harland German, Pharm D 03/23/2012 10:12 AM

## 2012-03-23 NOTE — H&P (Signed)
  Dr. Kittie Plater has recommended PCI for him.  I met the patient, his wife and son and reviewed the films in detail.  He is also appropriate for EVOLVE II.  Patient understands the risks and is agreeable to proceed.

## 2012-03-23 NOTE — Research (Signed)
EVOLVE II Informed Consent   Subject Name: Fernando Nelson  Subject met inclusion and exclusion criteria.  The informed consent form, study requirements and expectations were reviewed with the subject and questions and concerns were addressed prior to the signing of the consent form.  The subject verbalized understanding of the trail requirements.  The subject agreed to participate in the EVOLVE II trial and signed the informed consent.  Consent was obtained at 0930. The informed consent was obtained prior to performance of any protocol-specific procedures for the subject.  A copy of the signed informed consent was given to the subject and a copy was placed in the subject's medical record.  Claire Shown 03/23/2012, 4:18 PM

## 2012-03-23 NOTE — Progress Notes (Addendum)
 PROGRESS NOTE  Subjective:   Pt is feeling better.   ECG this AM is improved - less Inferior T wave inversion.  Objective:    Vital Signs:   Temp:  [97.7 F (36.5 C)-99.2 F (37.3 C)] 97.7 F (36.5 C) (08/02 0700) Pulse Rate:  [76-105] 83  (08/02 0700) Resp:  [13-22] 20  (08/02 0700) BP: (91-134)/(50-83) 101/55 mmHg (08/02 0700) SpO2:  [92 %-97 %] 96 % (08/02 0700) Weight:  [202 lb 13.2 oz (92 kg)-206 lb 6.4 oz (93.622 kg)] 202 lb 13.2 oz (92 kg) (08/01 1736)  Last BM Date: 03/22/12   24-hour weight change: Weight change:   Weight trends: Filed Weights   03/22/12 1736  Weight: 202 lb 13.2 oz (92 kg)    Intake/Output:  08/01 0701 - 08/02 0700 In: 884.6 [P.O.:240; I.V.:644.6] Out: 450 [Urine:450]     Physical Exam: BP 101/55  Pulse 83  Temp 97.7 F (36.5 C) (Oral)  Resp 20  Ht 5' 7" (1.702 m)  Wt 202 lb 13.2 oz (92 kg)  BMI 31.77 kg/m2  SpO2 96%  General: Vital signs reviewed and noted. Well-developed, well-nourished, in no acute distress; alert, appropriate and cooperative .  Head: Normocephalic, atraumatic.  Eyes: conjunctivae/corneas clear.  EOM's intact.   Throat: normal  Neck: Supple. Normal carotids. No JVD  Lungs:  Clear to auscultation  Heart: Regular rate,  With normal  S1 S2. No murmurs, gallops or rubs  Abdomen:  Soft, non-tender, non-distended with normoactive bowel sounds. No hepatomegaly. No rebound/guarding. No abdominal masses.  Extremities: Distal pedal pulses are 2+ .  No edema.    Neurologic: A&O X3, CN II - XII are grossly intact. Motor strength is 5/5 in the all 4 extremities.  Psych: Responds to questions appropriately with normal affect.    Labs: BMET:  Basename 03/22/12 1805  NA 137  K 3.8  CL 100  CO2 23  GLUCOSE 173*  BUN 16  CREATININE 0.68  CALCIUM 9.5  MG 1.9  PHOS --    Liver function tests:  Basename 03/22/12 1805  AST 16  ALT 17  ALKPHOS 118*  BILITOT 0.2*  PROT 8.1  ALBUMIN 3.6   No results found  for this basename: LIPASE:2,AMYLASE:2 in the last 72 hours  CBC:  Basename 03/23/12 0525 03/22/12 1805  WBC 11.5* 11.2*  NEUTROABS -- 4.6  HGB 13.8 15.4  HCT 41.1 45.1  MCV 84.9 84.5  PLT 242 257    Cardiac Enzymes:  Basename 03/23/12 0525 03/23/12 0024 03/22/12 1805  CKTOTAL 41 49 49  CKMB 1.3 1.3 1.3  TROPONINI <0.30 <0.30 <0.30    Coagulation Studies:  Basename 03/22/12 1805  LABPROT 13.5  INR 1.01    Other: No components found with this basename: POCBNP:3 No results found for this basename: DDIMER in the last 72 hours  Basename 03/22/12 1805  HGBA1C 11.8*    Basename 03/23/12 0525  CHOL 120  HDL 28*  LDLCALC 22  TRIG 350*  CHOLHDL 4.3    Basename 03/22/12 1805  TSH 1.649  T4TOTAL --  T3FREE --  THYROIDAB --   No results found for this basename: VITAMINB12,FOLATE,FERRITIN,TIBC,IRON,RETICCTPCT in the last 72 hours    Tele:  NSR  ECG 03/23/12 - NSR.  The inferior TWI and ST depression has improved since last night.  Medications:    Infusions:    . sodium chloride 10 mL/hr at 03/22/12 1900  . sodium chloride 1 mL/kg/hr (03/23/12 0334)  . heparin 1,550   Units/hr (03/23/12 0801)  . nitroGLYCERIN 25 mcg/min (03/22/12 2300)    Scheduled Medications:    . aspirin  324 mg Oral NOW   Or  . aspirin  300 mg Rectal NOW  . aspirin  324 mg Oral Pre-Cath  . aspirin EC  81 mg Oral Daily  . atorvastatin  80 mg Oral q1800  . diazepam  5 mg Oral On Call  . fenofibrate  160 mg Oral Daily  . heparin  3,000 Units Intravenous Once  . heparin  4,000 Units Intravenous Once  . insulin aspart  0-15 Units Subcutaneous TID WC  . insulin aspart  0-5 Units Subcutaneous QHS  . insulin glargine  13 Units Subcutaneous Once  . metoprolol tartrate  25 mg Oral BID  . multivitamin with minerals  1 tablet Oral Daily  . sodium chloride  3 mL Intravenous Q12H  . DISCONTD: aspirin EC  81 mg Oral Daily  . DISCONTD: colchicine  0.6 mg Oral Daily  . DISCONTD: insulin  detemir  26 Units Subcutaneous Daily  . DISCONTD: insulin NPH  10 Units Subcutaneous BID  . DISCONTD: sucralfate  1 g Oral QID    Assessment/ Plan:    1. Chest pressure / unstable angina:  His symptoms and ECG have improved overnight on heparin and IV NTG.  For cath today.  2. Hyperlipidemia:  LDL is actually quite low. Triglyceride levels are better but are still markedly elevated.  Have changed from Lopid / simvastatin to Tricor / Lipator.  3. Diabetes Mellitus:  metformain is currently on hold.  Using Lantus and SS insulin.  Glucoses are ok for now.     Disposition: cath today. Further disposition depending on results of cath. Length of Stay: 1  Philip J. Nahser, Jr., MD, FACC 03/23/2012, 8:03 AM Office [phone] Pager [phone]    

## 2012-03-23 NOTE — Progress Notes (Signed)
ANTICOAGULATION CONSULT NOTE - Follow Up Consult  Pharmacy Consult for heparin Indication: USAP  Labs:  Basename 03/23/12 0025 03/23/12 0024 03/22/12 1805  HGB -- -- 15.4  HCT -- -- 45.1  PLT -- -- 257  APTT -- -- 30  LABPROT -- -- 13.5  INR -- -- 1.01  HEPARINUNFRC <0.10* -- --  CREATININE -- -- 0.68  CKTOTAL -- 49 49  CKMB -- 1.3 1.3  TROPONINI -- <0.30 <0.30    Assessment: 50yo male undetectable on heparin with initial dosing for USAP.  Goal of Therapy:  Heparin level 0.3-0.7 units/ml   Plan:  Will give heparin bolus of 3000 units x1 and increase gtt by 4 units/kg/hr to 1550 units/hr and check level in 6hr.  Colleen Can PharmD BCPS 03/23/2012,3:05 AM

## 2012-03-23 NOTE — Progress Notes (Signed)
Inpatient Diabetes Program Recommendations  AACE/ADA: New Consensus Statement on Inpatient Glycemic Control (2013)  Target Ranges:  Prepandial:   less than 140 mg/dL      Peak postprandial:   less than 180 mg/dL (1-2 hours)      Critically ill patients:  140 - 180 mg/dL   Reason for Visit: Elevated HgbA1C of 11.8 %  Inpatient Diabetes Program Recommendations Insulin - Basal: Noted pt got 1/2 basal dose last HS.  CBG this am at 202 mg/dL.  Pt will need order for full home dose of Lantus to start tonight of 26 units. Correction (SSI): while NPO, please change correction to q 4 hrs. HgbA1C: Very elevated at 11.8%  Will try to talk with patient today before or after cath if pt available and able to discuss. Will order in-pt education while here using exit care notes and TV ed. network per bedside RN Diet: Please order carb modified aloong with heart healthy once eating.  Note: Thank you, Lenor Coffin, RN, CNS, Diabetes Coordinator [phone])

## 2012-03-23 NOTE — H&P (View-Only) (Signed)
PROGRESS NOTE  Subjective:   Pt is feeling better.   ECG this AM is improved - less Inferior T wave inversion.  Objective:    Vital Signs:   Temp:  [97.7 F (36.5 C)-99.2 F (37.3 C)] 97.7 F (36.5 C) (08/02 0700) Pulse Rate:  [76-105] 83  (08/02 0700) Resp:  [13-22] 20  (08/02 0700) BP: (91-134)/(50-83) 101/55 mmHg (08/02 0700) SpO2:  [92 %-97 %] 96 % (08/02 0700) Weight:  [202 lb 13.2 oz (92 kg)-206 lb 6.4 oz (93.622 kg)] 202 lb 13.2 oz (92 kg) (08/01 1736)  Last BM Date: 03/22/12   24-hour weight change: Weight change:   Weight trends: Filed Weights   03/22/12 1736  Weight: 202 lb 13.2 oz (92 kg)    Intake/Output:  08/01 0701 - 08/02 0700 In: 884.6 [P.O.:240; I.V.:644.6] Out: 450 [Urine:450]     Physical Exam: BP 101/55  Pulse 83  Temp 97.7 F (36.5 C) (Oral)  Resp 20  Ht 5\' 7"  (1.702 m)  Wt 202 lb 13.2 oz (92 kg)  BMI 31.77 kg/m2  SpO2 96%  General: Vital signs reviewed and noted. Well-developed, well-nourished, in no acute distress; alert, appropriate and cooperative .  Head: Normocephalic, atraumatic.  Eyes: conjunctivae/corneas clear.  EOM's intact.   Throat: normal  Neck: Supple. Normal carotids. No JVD  Lungs:  Clear to auscultation  Heart: Regular rate,  With normal  S1 S2. No murmurs, gallops or rubs  Abdomen:  Soft, non-tender, non-distended with normoactive bowel sounds. No hepatomegaly. No rebound/guarding. No abdominal masses.  Extremities: Distal pedal pulses are 2+ .  No edema.    Neurologic: A&O X3, CN II - XII are grossly intact. Motor strength is 5/5 in the all 4 extremities.  Psych: Responds to questions appropriately with normal affect.    Labs: BMET:  [facility] 03/22/12 1805  NA 137  K 3.8  CL 100  CO2 23  GLUCOSE 173*  BUN 16  CREATININE 0.68  CALCIUM 9.5  MG 1.9  PHOS --    Liver function tests:  Basename 03/22/12 1805  AST 16  ALT 17  ALKPHOS 118*  BILITOT 0.2*  PROT 8.1  ALBUMIN 3.6   No results found  for this basename: LIPASE:2,AMYLASE:2 in the last 72 hours  CBC:  Basename 03/23/12 0525 03/22/12 1805  WBC 11.5* 11.2*  NEUTROABS -- 4.6  HGB 13.8 15.4  HCT 41.1 45.1  MCV 84.9 84.5  PLT 242 257    Cardiac Enzymes:  Basename 03/23/12 0525 03/23/12 0024 03/22/12 1805  CKTOTAL 41 49 49  CKMB 1.3 1.3 1.3  TROPONINI <0.30 <0.30 <0.30    Coagulation Studies:  Basename 03/22/12 1805  LABPROT 13.5  INR 1.01    Other: No components found with this basename: POCBNP:3 No results found for this basename: DDIMER in the last 72 hours  Basename 03/22/12 1805  HGBA1C 11.8*    Basename 03/23/12 0525  CHOL 120  HDL 28*  LDLCALC 22  TRIG 657*  CHOLHDL 4.3    Basename 03/22/12 1805  TSH 1.649  T4TOTAL --  T3FREE --  THYROIDAB --   No results found for this basename: VITAMINB12,FOLATE,FERRITIN,TIBC,IRON,RETICCTPCT in the last 72 hours    Tele:  NSR  ECG 03/23/12 - NSR.  The inferior TWI and ST depression has improved since last night.  Medications:    Infusions:    . sodium chloride 10 mL/hr at 03/22/12 1900  . sodium chloride 1 mL/kg/hr (03/23/12 0334)  . heparin 1,550  Units/hr (03/23/12 0801)  . nitroGLYCERIN 25 mcg/min (03/22/12 2300)    Scheduled Medications:    . aspirin  324 mg Oral NOW   Or  . aspirin  300 mg Rectal NOW  . aspirin  324 mg Oral Pre-Cath  . aspirin EC  81 mg Oral Daily  . atorvastatin  80 mg Oral q1800  . diazepam  5 mg Oral On Call  . fenofibrate  160 mg Oral Daily  . heparin  3,000 Units Intravenous Once  . heparin  4,000 Units Intravenous Once  . insulin aspart  0-15 Units Subcutaneous TID WC  . insulin aspart  0-5 Units Subcutaneous QHS  . insulin glargine  13 Units Subcutaneous Once  . metoprolol tartrate  25 mg Oral BID  . multivitamin with minerals  1 tablet Oral Daily  . sodium chloride  3 mL Intravenous Q12H  . DISCONTD: aspirin EC  81 mg Oral Daily  . DISCONTD: colchicine  0.6 mg Oral Daily  . DISCONTD: insulin  detemir  26 Units Subcutaneous Daily  . DISCONTD: insulin NPH  10 Units Subcutaneous BID  . DISCONTD: sucralfate  1 g Oral QID    Assessment/ Plan:    1. Chest pressure / unstable angina:  His symptoms and ECG have improved overnight on heparin and IV NTG.  For cath today.  2. Hyperlipidemia:  LDL is actually quite low. Triglyceride levels are better but are still markedly elevated.  Have changed from Lopid / simvastatin to Tricor / Lipator.  3. Diabetes Mellitus:  metformain is currently on hold.  Using Lantus and SS insulin.  Glucoses are ok for now.     Disposition: cath today. Further disposition depending on results of cath. Length of Stay: 1  Vesta Mixer, Montez Hageman., MD, [facility] 03/23/2012, 8:03 AM Office [phone] Pager [phone]

## 2012-03-23 NOTE — Care Management Note (Signed)
    Page 1 of 1   03/23/2012     9:47:07 AM   CARE MANAGEMENT NOTE 03/23/2012  Patient:  Fernando Nelson, Fernando Nelson   Account Number:  [id number]  Date Initiated:  03/23/2012  Documentation initiated by:  Junius Creamer  Subjective/Objective Assessment:   adm w angina     Action/Plan:   lives w wife, pcp dr Nelva Nay harris   Anticipated DC Date:     Anticipated DC Plan:        DC Planning Services  CM consult      Choice offered to / List presented to:             Status of service:   Medicare Important Message given?   (If response is "NO", the following Medicare IM given date fields will be blank) Date Medicare IM given:   Date Additional Medicare IM given:    Discharge Disposition:    Per UR Regulation:  Reviewed for med. necessity/level of care/duration of stay  If discussed at Long Length of Stay Meetings, dates discussed:    Comments:  8/2 9:45a debbie Makaelyn Aponte rn,bsn [phone]

## 2012-03-23 NOTE — CV Procedure (Addendum)
Cardiac Cath Procedure Note:  Indication: [place]  Procedures performed:  1) Selective coronary angiography 2) Left heart catheterization 3) Left ventriculogram  Description of procedure:   The risks and indication of the procedure were explained. Consent was signed and placed on the chart. An appropriate timeout was taken prior to the procedure. After a normal Allen's test was confirmed, the right wrist was prepped and draped in the routine sterile fashion and anesthetized with 1% local lidocaine.   A 5 FR arterial sheath was then placed in the right radial artery using a modified Seldinger technique. Systemic heparin was administered. 3mg  IV verapamil was given through the sheath. Standard catheters including a AL-1, JR4 and straight pigtail were used. All catheter exchanges were made over a wire.  Complications:  None apparent  Findings:  Ao Pressure: 101/65 (83) LV Pressure: 115/11/14 There was no signficant gradient across the aortic valve on pullback.  Left main: Normal  LAD: Moderate-sized vessel. Gives off two diagonals. 30% proximal. Mild to moderate plaque through mid to distal LAD with 40% focal lesion. Apical LAD very small (? Very distal occlusion). No change with IC NTG.                D1 60-70% proximal.  LCX: Moderate-sized vessel. Minimal plaque  RCA: Large dominant vessel. 90% proximal  LV-gram done in the RAO projection: Ejection fraction = 50% no clear regional wall motion abnormalities.  Assessment: 1. CAD with high-grade proximal RCA lesion 2. EF ~ 50%  Plan/Discussion:  PCI RCA. Aggressive risk factor modification with particular attention to controlling blood sugars and cholesterol.   Kojo Liby 11:38 AM

## 2012-03-24 ENCOUNTER — Encounter (HOSPITAL_COMMUNITY): Payer: Self-pay | Admitting: Physician Assistant

## 2012-03-24 DIAGNOSIS — E785 Hyperlipidemia, unspecified: Secondary | ICD-10-CM

## 2012-03-24 DIAGNOSIS — I200 Unstable angina: Secondary | ICD-10-CM

## 2012-03-24 DIAGNOSIS — E119 Type 2 diabetes mellitus without complications: Secondary | ICD-10-CM

## 2012-03-24 DIAGNOSIS — I2510 Atherosclerotic heart disease of native coronary artery without angina pectoris: Principal | ICD-10-CM

## 2012-03-24 LAB — BASIC METABOLIC PANEL
CO2: 26 mEq/L (ref 19–32)
Chloride: 104 mEq/L (ref 96–112)
GFR calc Af Amer: >90 mL/min (ref >90)
Potassium: 4 mEq/L (ref 3.5–5.1)
Sodium: 140 mEq/L (ref 135–145)

## 2012-03-24 LAB — CK TOTAL AND CKMB (NOT AT ARMC)
CK, MB: 1.3 ng/mL (ref 0.3–4.0)
Relative Index: INVALID (ref 0.0–2.5)
Total CK: 42 U/L (ref 7–232)

## 2012-03-24 LAB — CBC
Platelets: 227 10*3/uL (ref 150–400)
RBC: 4.78 MIL/uL (ref 4.22–5.81)
RDW: 13.3 % (ref 11.5–15.5)
WBC: 8 10*3/uL (ref 4.0–10.5)

## 2012-03-24 LAB — GLUCOSE, CAPILLARY: Glucose-Capillary: 301 mg/dL — ABNORMAL HIGH (ref 70–99)

## 2012-03-24 MED ORDER — FENOFIBRATE 160 MG PO TABS: 160.0000 mg | ORAL_TABLET | Freq: Every day | ORAL | Status: DC

## 2012-03-24 MED ORDER — PRASUGREL HCL 10 MG PO TABS: 10.0000 mg | ORAL_TABLET | Freq: Every day | ORAL | Status: DC

## 2012-03-24 MED ORDER — ATORVASTATIN CALCIUM 80 MG PO TABS: 80.0000 mg | ORAL_TABLET | Freq: Every day | ORAL | Status: DC

## 2012-03-24 MED ORDER — NITROGLYCERIN 0.4 MG SL SUBL: 0.4000 mg | SUBLINGUAL_TABLET | SUBLINGUAL | Status: DC | PRN

## 2012-03-24 MED ORDER — INDOMETHACIN 25 MG PO CAPS: 25.0000 mg | ORAL_CAPSULE | Freq: Three times a day (TID) | ORAL | Status: DC | PRN

## 2012-03-24 MED ORDER — METFORMIN HCL 500 MG PO TABS: 1000.0000 mg | ORAL_TABLET | Freq: Two times a day (BID) | ORAL | Status: DC

## 2012-03-24 MED ORDER — METOPROLOL TARTRATE 25 MG PO TABS: 25.0000 mg | ORAL_TABLET | Freq: Two times a day (BID) | ORAL | Status: DC

## 2012-03-24 NOTE — Progress Notes (Signed)
Primary cardiologist: Dr. Elease Hashimoto  Subjective: No chest pain or shortness of breath.   Objective: Blood pressure 114/63, pulse 78, temperature 98.2 F (36.8 C), temperature source Oral, resp. rate 18, height 5\' 7"  (1.702 m), weight 202 lb 13.2 oz (92 kg), SpO2 95.00%.  Intake/Output Summary (Last 24 hours) at 03/24/12 0811 Last data filed at 03/24/12 0500  Gross per 24 hour  Intake   1050 ml  Output   1450 ml  Net   -400 ml   Telemetry - Sinus rhythm.  Exam -   General - No acute distress.  Lungs - Clear, nonlabored.  Cardiac - Regular rate and rhythm, no gallop.  Extremities - Right wrist access site stable.  Lab Results -  Basic Metabolic Panel:  Lab 03/22/12 5366  NA 137  K 3.8  CL 100  CO2 23  GLUCOSE 173*  BUN 16  CREATININE 0.68  CALCIUM 9.5  MG 1.9    Liver Function Tests:  Lab 03/22/12 1805  AST 16  ALT 17  ALKPHOS 118*  BILITOT 0.2*  PROT 8.1  ALBUMIN 3.6    CBC:  Lab 03/24/12 0635 03/23/12 0525 03/22/12 1805  WBC 8.0 11.5* 11.2*  HGB 13.6 13.8 15.4  HCT 40.7 41.1 45.1  MCV 85.1 84.9 84.5  PLT 227 242 257    Cardiac Enzymes:  Lab 03/24/12 0635 03/23/12 2141 03/23/12 0525 03/23/12 0024 03/22/12 1805  CKTOTAL 42 41 41 -- --  CKMB 1.3 1.3 1.3 -- --  CKMBINDEX -- -- -- -- --  TROPONINI -- -- <0.30 <0.30 <0.30   ECG - Normal sinus rhythm, no acute ST segment changes.  Current Medications    . aspirin EC  81 mg Oral Daily  . atorvastatin  80 mg Oral q1800  . bivalirudin      . diazepam  5 mg Oral On Call  . fenofibrate  160 mg Oral Daily  . fentaNYL      . fentaNYL  50 mcg Intravenous Once  . heparin      . heparin      . heparin      . insulin aspart  0-15 Units Subcutaneous TID WC  . insulin aspart  0-5 Units Subcutaneous QHS  . lidocaine      . lidocaine      . living well with diabetes book   Does not apply Once  . metoprolol      . metoprolol tartrate  25 mg Oral BID  . midazolam      . midazolam      .  multivitamin with minerals  1 tablet Oral Daily  . nitroGLYCERIN      . nitroGLYCERIN      . prasugrel  10 mg Oral Daily  . prasugrel  60 mg Oral Once  . verapamil      . verapamil      . DISCONTD: sodium chloride  3 mL Intravenous Q12H    Assessment:  1. Unstable angina, negative cardiac enzymes.  2. CAD, high-grade proximal RCA stenosis status post DES on 8/2. Patient enrolled in the EVOLVE II study per Dr. Riley Kill. Plan DAPT for one year.  3. Hyperlipidemia, mainly hypertriglyceridemia at this point. On TriCor and Lipitor.  4. Type 2 diabetes mellitus, metformin has been on hold with recent angiography.  Plan:  Ambulate in hall. If remains stable, anticipate discharge home today on current medical regimen. He will need followup in 2 weeks with Dr. Elease Hashimoto. Check BMET  for that visit.  Jonelle Sidle, M.D., F.A.C.C.

## 2012-03-24 NOTE — Discharge Summary (Signed)
See also my rounding note from today.  Jonelle Sidle, M.D., F.A.C.C.

## 2012-03-24 NOTE — Progress Notes (Signed)
CARDIAC REHAB PHASE I   PRE:  Rate/Rhythm: 90 SR BP:  Supine:   Sitting: 124/60  Standing:    SaO2:   MODE:  Ambulation: 600 ft   POST:  Rate/Rhythem: 95 SR  BP:  Supine:   Sitting:  Standing: 116/61   SaO2:   Kahron Kauth, Margie Billet  Order received. Pt ambulated 600 ft assist x 1. Tolerated very well. No symptoms reported. No rest breaks.Pt returned to bedside. VSS. Discharge education: stent book/card, diet, home exercise, NTG use, signs & symptoms, phase II cardiac rehab. Will send referral for phase II. Electronically signed by Harriett Sine MS on Saturday March 24 2012 at [phone]

## 2012-03-24 NOTE — Discharge Summary (Signed)
Discharge Summary   Patient ID: Fernando Nelson MRN: [id number], DOB/AGE: 1961-07-28 51 y.o. Admit date: 03/22/2012 D/C date:     03/24/2012  Primary Cardiologist: Nahser  Primary Discharge Diagnoses:  1. Newly diagnosed CAD with unstable angina - s/p PTCA/Evolve II study stent to RCA 03/23/12, residual LAD dz 2. Diabetes mellitus, uncontrolled (A1C 11.8) 3. Hyperlipidemia - Zocor changed to Lipitor/gemfibrozil changed to fenofibrate - will need f/u lipids/LFTs as OP  Secondary Discharge Diagnoses:  1. Asthma 2. Hepatitis B  Hospital Course: 51 y/o M with recent admission to [facility] for CP and markedly elevated glucose and triglyceride levels. He ruled out for MI. Per report, no stress testing was done. Since that time, he described recurrent chest heaviness with radiation to his jaw down his left arm. This occurred both with rest and exertion. His symptoms were considered worrisome for unstable angina. EKG on 03/22/12 demonstrated sinus tachycardia at 101 with ST depression /T wave inversion inferiorly with T wave flattening in the lateral leads. These ST segment depressions and T-wave inversions were new from his EKG last week at [facility]. He was admitted to [facility] and placed on IV heparin and IV NTG. Metoprolol was added for tachycardia. CE's remained negative negative. F/u EKG the AM of 03/23/12 was improved with less inferior T wave inversion. He underwent cardiac cath that day demonstrating high grade proximal RCA lesion, with EF 50%. He also had moderate residual dz in the LAD (Moderate-sized vessel. Gives off two diagonals. 30% proximal. Mild to moderate plaque through mid to distal LAD with 40% focal lesion. Apical LAD very small, ? Very distal occlusion. No change with IC NTG. D1 60-70% proximal.). He subsequently underwent PTCA/Evolve II study stent to the RCA. He tolerated this procedure well. Of note, his gemfibrozil and zocor were changed to lipitor and  fenofibrate this admission. This morning he is feeling well. Dr. Diona Browner has seen and examined him and feels he is stable for discharge.  Discharge Vitals: Blood pressure 114/63, pulse 78, temperature 98.2 F (36.8 C), temperature source Oral, resp. rate 18, height 5\' 7"  (1.702 m), weight 202 lb 13.2 oz (92 kg), SpO2 95.00%.  Labs: Lab Results  Component Value Date   WBC 8.0 03/24/2012   HGB 13.6 03/24/2012   HCT 40.7 03/24/2012   MCV 85.1 03/24/2012   PLT 227 03/24/2012     Lab 03/22/12 1805  NA 137  K 3.8  CL 100  CO2 23  BUN 16  CREATININE 0.68  CALCIUM 9.5  PROT 8.1  BILITOT 0.2*  ALKPHOS 118*  ALT 17  AST 16  GLUCOSE 173*    Basename 03/24/12 0635 03/23/12 2141 03/23/12 0525 03/23/12 0024 03/22/12 1805  CKTOTAL 42 41 41 49 --  CKMB 1.3 1.3 1.3 1.3 --  TROPONINI -- -- <0.30 <0.30 <0.30   Lab Results  Component Value Date   CHOL 120 03/23/2012   HDL 28* 03/23/2012   LDLCALC 22 03/23/2012   TRIG 350* 03/23/2012    Diagnostic Studies/Procedures   1. Cardiac catheterization this admission, please see full report and above for summary.   Discharge Medications   Medication List  As of 03/24/2012  9:18 AM   STOP taking these medications         gemfibrozil 600 MG tablet      simvastatin 40 MG tablet         TAKE these medications         aspirin  EC 81 MG tablet   Take 81 mg by mouth every morning.      atorvastatin 80 MG tablet   Commonly known as: LIPITOR   Take 1 tablet (80 mg total) by mouth at bedtime.      bismuth subsalicylate 262 MG chewable tablet   Commonly known as: PEPTO BISMOL   Chew 524 mg by mouth 3 (three) times daily as needed. For indigestion      colchicine 0.6 MG tablet   Take 0.6 mg by mouth daily as needed. For gout attack      fenofibrate 160 MG tablet   Take 1 tablet (160 mg total) by mouth daily.      HYDROcodone-acetaminophen 5-500 MG per tablet   Commonly known as: VICODIN   Take 1-2 tablets by mouth every 6 (six) hours as needed.  For pain      indomethacin 25 MG capsule   Commonly known as: INDOCIN   Take 1 capsule (25 mg total) by mouth 3 (three) times daily as needed. For gout attack      insulin aspart 100 UNIT/ML injection   Commonly known as: novoLOG   Inject 3-7 Units into the skin 3 (three) times daily before meals. If fasting blood sugar is >200 takes 5 units then 3 units and an additional 2 units for each 15 grams of carbohydrates with meals      insulin glargine 100 UNIT/ML injection   Commonly known as: LANTUS   Inject 26 Units into the skin at bedtime. Increase by 3 units every 3 days until fasting blood sugar <150      metFORMIN 500 MG tablet   Commonly known as: GLUCOPHAGE   Take 2 tablets (1,000 mg total) by mouth 2 (two) times daily.   Start taking on: 03/25/2012      metoprolol tartrate 25 MG tablet   Commonly known as: LOPRESSOR   Take 1 tablet (25 mg total) by mouth 2 (two) times daily.      multivitamin with minerals Tabs   Take 1 tablet by mouth daily.      nitroGLYCERIN 0.4 MG SL tablet   Commonly known as: NITROSTAT   Place 1 tablet (0.4 mg total) under the tongue every 5 (five) minutes x 3 doses as needed for chest pain.      prasugrel 10 MG Tabs   Commonly known as: EFFIENT   Take 1 tablet (10 mg total) by mouth daily.          he was instructed to only take indomethacin PRN because of increased risk of stomach bleeding. He is also not to resume metformin until 03/25/12 PM.  Disposition   The patient will be discharged in stable condition to home. Discharge Orders    Future Orders Please Complete By Expires   Diet - low sodium heart healthy      Comments:   Diabetic Diet   Increase activity slowly      Comments:   No driving for 2 days. No lifting over 5 lbs for 1 week. No sexual activity for 1 week. You may return to work on 03/31/12 if you are feeling well. Keep procedure site clean & dry. If you notice increased pain, swelling, bleeding or pus, call/return!  You may  shower, but no soaking baths/hot tubs/pools for 1 week.     Follow-up Information    Follow up with Elyn Aquas., MD. (Our office will call you for appt)    Contact information:  [address], [address] [place] [address] [phone]       Follow up with Primary Care Provider. (Please continue to follow up closely for your cholesterol & uncontrolled diabetes)            Duration of Discharge Encounter: Greater than 30 minutes including physician and PA time.  Signed, Oda Placke PA-C 03/24/2012, 9:18 AM

## 2012-03-26 MED FILL — Dextrose Inj 5%: INTRAVENOUS | Qty: 50 | Status: AC

## 2012-03-27 ENCOUNTER — Telehealth: Payer: Self-pay | Admitting: Cardiovascular Disease

## 2012-03-27 NOTE — Telephone Encounter (Signed)
Please return call to patient [phone], regarding questions about medical care

## 2012-03-27 NOTE — Telephone Encounter (Signed)
Pt had stent placed 03/23/12, radial approach, pt has f/u app set, I informed him of date and time. Access site per pt is free of pain, denies redness, no edema. Pt states he is feeling fine. Reviewed new cardiac meds/ cholesterol meds and effient also reviewed and importance of its use. Explained importance of exercise, diet, lowering cholesterol, diabetes control, and taking meds as prescribed.  Pt sees endocrinologist this week, pt unable to get diabetes under control/ FBS in the 200's. Pt receptive of teaching and told to call with further questions or concerns.  Pt agreed to plan.

## 2012-04-06 ENCOUNTER — Ambulatory Visit: Payer: Managed Care, Other (non HMO) | Admitting: Cardiovascular Disease

## 2012-04-09 ENCOUNTER — Ambulatory Visit (INDEPENDENT_AMBULATORY_CARE_PROVIDER_SITE_OTHER): Payer: Managed Care, Other (non HMO) | Admitting: Nurse Practitioner

## 2012-04-09 ENCOUNTER — Other Ambulatory Visit: Payer: Self-pay | Admitting: *Deleted

## 2012-04-09 ENCOUNTER — Encounter: Payer: Self-pay | Admitting: Nurse Practitioner

## 2012-04-09 VITALS — BP 100/70 | HR 88 | Ht 66.75 in | Wt 207.8 lb

## 2012-04-09 DIAGNOSIS — I2510 Atherosclerotic heart disease of native coronary artery without angina pectoris: Secondary | ICD-10-CM

## 2012-04-09 LAB — BASIC METABOLIC PANEL
BUN: 16 mg/dL (ref 6–23)
CO2: 24 mEq/L (ref 19–32)
Calcium: 9 mg/dL (ref 8.4–10.5)
Chloride: 106 mEq/L (ref 96–112)
Creatinine, Ser: 0.7 mg/dL (ref 0.4–1.5)
GFR: 126.87 mL/min (ref >60.00)
Glucose, Bld: 126 mg/dL — ABNORMAL HIGH (ref 70–99)
Potassium: 3.9 mEq/L (ref 3.5–5.1)
Sodium: 137 mEq/L (ref 135–145)

## 2012-04-09 NOTE — Progress Notes (Signed)
Fernando Nelson Date of Birth: 1960-11-05 Medical Record #[id number]  History of Present Illness: Fernando Nelson is seen today for a post hospital visit. He is seen for Dr. Elease Hashimoto. He has newly diagnosed CAD with unstable angina. He is s/p PTCA/Evolve II study stent to the RCA and has residual LAD disease. Other issues include uncontrolled DM (A1C of 11.8), HLD, asthma and hepatitis B. His cholesterol medicines were changed during this admission. EF is only about 50%.   He comes in today. He is here alone. He is doing well. He is now on insulin and his sugars are coming down. Was referred to Dr. Lucianne Muss. No chest pain. Does feel a little fleeting "pinch" but nothing like his prior syndrome. He is walking. Not short of breath. Not sure about his Metoprolol and he will check that when he gets back home. No problems with his cath access site. Already back to work. Tolerating his medicines without issue. Was not referred to cardiac rehab.   Current Outpatient Prescriptions on File Prior to Visit  Medication Sig Dispense Refill  . aspirin EC 81 MG tablet Take 81 mg by mouth every morning.      Marland Kitchen atorvastatin (LIPITOR) 80 MG tablet Take 1 tablet (80 mg total) by mouth at bedtime.  30 tablet  6  . bismuth subsalicylate (PEPTO BISMOL) 262 MG chewable tablet Chew 524 mg by mouth 3 (three) times daily as needed. For indigestion      . colchicine 0.6 MG tablet Take 0.6 mg by mouth daily as needed. For gout attack      . fenofibrate 160 MG tablet Take 1 tablet (160 mg total) by mouth daily.  30 tablet  6  . HYDROcodone-acetaminophen (VICODIN) 5-500 MG per tablet Take 1-2 tablets by mouth every 6 (six) hours as needed. For pain      . indomethacin (INDOCIN) 25 MG capsule Take 1 capsule (25 mg total) by mouth 3 (three) times daily as needed. For gout attack      . insulin aspart (NOVOLOG) 100 UNIT/ML injection Inject 3-7 Units into the skin 3 (three) times daily before meals. If fasting blood sugar is >200 takes 5  units then 3 units and an additional 2 units for each 15 grams of carbohydrates with meals      . insulin glargine (LANTUS) 100 UNIT/ML injection Inject 26 Units into the skin at bedtime. Increase by 3 units every 3 days until fasting blood sugar <150      . metFORMIN (GLUCOPHAGE) 500 MG tablet Take 2 tablets (1,000 mg total) by mouth 2 (two) times daily.      . metoprolol tartrate (LOPRESSOR) 25 MG tablet Take 1 tablet (25 mg total) by mouth 2 (two) times daily.  60 tablet  6  . Multiple Vitamin (MULTIVITAMIN WITH MINERALS) TABS Take 1 tablet by mouth daily.      . nitroGLYCERIN (NITROSTAT) 0.4 MG SL tablet Place 1 tablet (0.4 mg total) under the tongue every 5 (five) minutes x 3 doses as needed for chest pain.  25 tablet  4  . prasugrel (EFFIENT) 10 MG TABS Take 1 tablet (10 mg total) by mouth daily.  30 tablet  6    No Known Allergies  Past Medical History  Diagnosis Date  . Hyperlipemia   . Asthma     "multiple times; last time 06/2011" (03/23/12)  . Type II diabetes mellitus   . Hepatitis B ~ 2000    "donated blood to [employer];  got letter; had shots over 6 months; gone now"  . CAD (coronary artery disease)     a. [place] 02/2012 - s/p PTCA/Evolve II study stent to RCA 03/23/12, residual LAD dz    Past Surgical History  Procedure Date  . Coronary angioplasty with stent placement 03/23/12    "1; first one ever"  . Shoulder open rotator cuff repair 2005    left    History  Smoking status  . Former Smoker -- 1.0 packs/day for 25 years  . Types: Cigarettes  . Quit date: 09/22/2009  Smokeless tobacco  . Never Used    History  Alcohol Use No    Family History  Problem Relation Age of Onset  . Heart failure Mother     Review of Systems: The review of systems is per the HPI.  All other systems were reviewed and are negative.  Physical Exam: There were no vitals taken for this visit. Patient is very pleasant and in no acute distress. Skin is warm and dry. Color is normal.   HEENT is unremarkable. Normocephalic/atraumatic. PERRL. Sclera are nonicteric. Neck is supple. No masses. No JVD. Lungs are clear. Cardiac exam shows a regular rate and rhythm. Abdomen is soft. Extremities are without edema. Gait and ROM are intact. No gross neurologic deficits noted.   LABORATORY DATA: BMET is pending for today.   Cardiac Cath Note:  Left main: Normal  LAD: Moderate-sized vessel. Gives off two diagonals. 30% proximal. Mild to moderate plaque through mid to distal LAD with 40% focal lesion. Apical LAD very small (? Very distal occlusion). No change with IC NTG.  D1 60-70% proximal.  LCX: Moderate-sized vessel. Minimal plaque  RCA: Large dominant vessel. 90% proximal  LV-gram done in the RAO projection: Ejection fraction = 50% no clear regional wall motion abnormalities.   Assessment:  1. CAD with high-grade proximal RCA lesion  2. EF ~ 50%   Plan/Discussion:  PCI RCA. Aggressive risk factor modification with particular attention to controlling blood sugars and cholesterol.  Daniel Bensimhon  11:38 AM  PCI  Lesion Data:  Vessel: RCA  Percent stenosis (pre): 95%  TIMI-flow (pre): 3  Stent: 3.5 EVOLVE II study stent by 20mm  Percent stenosis (post): 0  TIMI-flow (post): 3  Conclusions:  1. Successful PCI of the RCA for USAP   Recommendations:  1. DAPT  Shawnie Pons  03/23/2012, 4:21 PM  Lab Results  Component Value Date   WBC 8.0 03/24/2012   HGB 13.6 03/24/2012   HCT 40.7 03/24/2012   PLT 227 03/24/2012   GLUCOSE 209* 03/24/2012   CHOL 120 03/23/2012   TRIG 350* 03/23/2012   HDL 28* 03/23/2012   LDLCALC 22 03/23/2012   ALT 17 03/22/2012   AST 16 03/22/2012   NA 140 03/24/2012   K 4.0 03/24/2012   CL 104 03/24/2012   CREATININE 0.75 03/24/2012   BUN 10 03/24/2012   CO2 26 03/24/2012   TSH 1.649 03/22/2012   INR 1.01 03/22/2012   HGBA1C 11.8* 03/22/2012    Assessment / Plan:

## 2012-04-09 NOTE — Assessment & Plan Note (Signed)
He has newly diagnosed CAD. Now with Evolve II study stent in the RCA. Has residual LAD disease which will be managed medically. Have reviewed with Dr. Elease Hashimoto. No need for stress testing at this time but will consider in the future. He will continue with risk factor modification. Will check BMET today. Encouraged his activity/exercise. Have referred to cardiac rehab. Will see him back in 4 weeks with fasting labs. Patient is agreeable to this plan and will call if any problems develop in the interim.

## 2012-04-09 NOTE — Patient Instructions (Addendum)
We will send a referral to cardiac rehab.  We will check lab today.  We will see you in 4 weeks with fasting labs  Stay on your current medicines and verify your medicines with the list you are given today.  Continue to work on your blood sugar.  Call the [facility] office at [phone] if you have any questions, problems or concerns.

## 2012-04-10 ENCOUNTER — Telehealth: Payer: Self-pay | Admitting: *Deleted

## 2012-04-10 NOTE — Telephone Encounter (Signed)
Called patient and discussed labs.  Fernando Nelson, CMA

## 2012-04-10 NOTE — Telephone Encounter (Signed)
Message copied by Awilda Bill on Tue Apr 10, 2012 12:07 PM ------      Message from: Fernando Nelson      Created: Mon Apr 09, 2012  4:24 PM       Ok to report. Labs are satisfactory.

## 2012-05-08 ENCOUNTER — Ambulatory Visit (INDEPENDENT_AMBULATORY_CARE_PROVIDER_SITE_OTHER): Payer: Managed Care, Other (non HMO) | Admitting: *Deleted

## 2012-05-08 ENCOUNTER — Encounter: Payer: Self-pay | Admitting: Cardiovascular Disease

## 2012-05-08 ENCOUNTER — Ambulatory Visit (INDEPENDENT_AMBULATORY_CARE_PROVIDER_SITE_OTHER): Payer: Managed Care, Other (non HMO) | Admitting: Cardiovascular Disease

## 2012-05-08 VITALS — BP 118/81 | HR 72 | Ht 66.0 in | Wt 213.4 lb

## 2012-05-08 DIAGNOSIS — I2510 Atherosclerotic heart disease of native coronary artery without angina pectoris: Secondary | ICD-10-CM

## 2012-05-08 DIAGNOSIS — E785 Hyperlipidemia, unspecified: Secondary | ICD-10-CM

## 2012-05-08 LAB — HEPATIC FUNCTION PANEL
AST: 20 U/L (ref 0–37)
Albumin: 3.7 g/dL (ref 3.5–5.2)
Alkaline Phosphatase: 53 U/L (ref 39–117)
Bilirubin, Direct: 0 mg/dL (ref 0.0–0.3)

## 2012-05-08 LAB — BASIC METABOLIC PANEL
BUN: 16 mg/dL (ref 6–23)
Chloride: 103 mEq/L (ref 96–112)
GFR: 90.26 mL/min (ref >60.00)
Potassium: 4.1 mEq/L (ref 3.5–5.1)
Sodium: 138 mEq/L (ref 135–145)

## 2012-05-08 LAB — LDL CHOLESTEROL, DIRECT: Direct LDL: 92.9 mg/dL

## 2012-05-08 LAB — LIPID PANEL
HDL: 26.9 mg/dL — ABNORMAL LOW (ref >39.00)
VLDL: 44 mg/dL — ABNORMAL HIGH (ref 0.0–40.0)

## 2012-05-08 NOTE — Assessment & Plan Note (Signed)
Fernando Nelson will continue with his current medications. We had some discussion about a recent study regarding omega-3 fatty acids. The study was published in the [place] Journal of Medicine in 04/13/2012. The study did not show any beneficial effect in cardiovascular mortality or morbidity the patient's with multiple cardiovascular risk factors.  The study did not include patient who had a myocardial infarction or congestive heart failure.  To continue with his current dose of atorvastatin

## 2012-05-08 NOTE — Patient Instructions (Addendum)
Your physician wants you to follow-up in: 6 months You will receive a reminder letter in the mail two months in advance. If you don't receive a letter, please call our office to schedule the follow-up appointment.  Your physician recommends that you return for a FASTING lipid profile: today and in 6 months  

## 2012-05-08 NOTE — Progress Notes (Signed)
Fernando Nelson Date of Birth  1961/03/12       [facility]    [place] [address]  [address] [place], [place]  [address]   [place], [place]  [address] [phone]     [phone]   Fax  [phone]    Fax [phone]  Problem List: 1. Coronary Artery disease- s/p 3.5 EVOLVE II study stent by 20 mm to the RCA 2. Diabetes Mellitus 3. Hyperlipidemia / hypertriglyceridemia   History of Present Illness:  Fernando Nelson is a 51 yo with a recent episode of CP. He was admitted with unstable angina. He was found to have a tight right coronary artery stenosis and had placement of a 3.5 mm drug-eluting stent to the RCA.  He has had twinges of chest pain that last only several seconds.  He is exercising 4 times a week.   Current Outpatient Prescriptions on File Prior to Visit  Medication Sig Dispense Refill  . aspirin EC 81 MG tablet Take 81 mg by mouth every morning.      Marland Kitchen atorvastatin (LIPITOR) 80 MG tablet Take 1 tablet (80 mg total) by mouth at bedtime.  30 tablet  6  . fenofibrate 160 MG tablet Take 1 tablet (160 mg total) by mouth daily.  30 tablet  6  . fish oil-omega-3 fatty acids 1000 MG capsule Take 3 g by mouth daily.      . metFORMIN (GLUCOPHAGE) 1000 MG tablet Take 1,000 mg by mouth 2 (two) times daily with a meal.      . metoprolol tartrate (LOPRESSOR) 25 MG tablet Take 1 tablet (25 mg total) by mouth 2 (two) times daily.  60 tablet  6  . Multiple Vitamin (MULTIVITAMIN WITH MINERALS) TABS Take 1 tablet by mouth daily.      . nitroGLYCERIN (NITROSTAT) 0.4 MG SL tablet Place 1 tablet (0.4 mg total) under the tongue every 5 (five) minutes x 3 doses as needed for chest pain.  25 tablet  4  . prasugrel (EFFIENT) 10 MG TABS Take 1 tablet (10 mg total) by mouth daily.  30 tablet  6    No Known Allergies  Past Medical History  Diagnosis Date  . Hyperlipemia   . Asthma     "multiple times; last time 06/2011" (03/23/12)  . Type II  diabetes mellitus   . Hepatitis B ~ 2000    "donated blood to [employer]; got letter; had shots over 6 months; gone now"  . CAD (coronary artery disease)     a. [place] 02/2012 - s/p PTCA/Evolve II study stent to RCA 03/23/12, residual LAD dz    Past Surgical History  Procedure Date  . Coronary angioplasty with stent placement 03/23/12    "1; first one ever"  . Shoulder open rotator cuff repair 2005    left    History  Smoking status  . Former Smoker -- 1.0 packs/day for 25 years  . Types: Cigarettes  . Quit date: 09/22/2009  Smokeless tobacco  . Never Used    History  Alcohol Use No    Family History  Problem Relation Age of Onset  . Heart failure Mother     Reviw of Systems:  Reviewed in the HPI.  All other systems are negative.  Physical Exam: Blood pressure 118/81, pulse 72, height 5\' 6"  (1.676 m), weight 213 lb 6.4 oz (96.798 kg). General: Well developed, well nourished, in no acute distress.  Head: Normocephalic,  atraumatic, sclera non-icteric, mucus membranes are moist,   Neck: Supple. Carotids are 2 + without bruits. No JVD  Lungs: Clear bilaterally to auscultation.  Heart: regular rate.  normal  S1 S2. No murmurs, gallops or rubs.  Abdomen: Soft, non-tender, non-distended with normal bowel sounds. No hepatomegaly. No rebound/guarding. No masses.  Msk:  Strength and tone are normal  Extremities: No clubbing or cyanosis. No edema.  Distal pedal pulses are 2+  Neuro: Alert and oriented X 3. Moves all extremities spontaneously.  Psych:  Responds to questions appropriately with a normal affect.  ECG:  Assessment / Plan:

## 2012-05-08 NOTE — Assessment & Plan Note (Signed)
Fernando Nelson is doing very well. He has not had any further episodes of angina chest pain. He has occasional episodes of twinges of chest pain that do not sound anginal in character. These episodes only last for a few seconds.  He's been exercising and works out 4 days a week.  I've encouraged him to continue with a good diet, exercise, and weight loss program. We will check fasting labs today including lipid profile, basic metabolic profile, and hepatic profile. I'll see him again in 6 months for fasting lipid profile, hepatic profile, and basic metabolic profile. We'll also check an EKG.

## 2012-05-09 ENCOUNTER — Encounter: Payer: Self-pay | Admitting: *Deleted

## 2012-05-21 ENCOUNTER — Encounter: Payer: Self-pay | Admitting: *Deleted

## 2012-08-17 ENCOUNTER — Other Ambulatory Visit: Payer: Self-pay | Admitting: *Deleted

## 2012-08-17 MED ORDER — NITROGLYCERIN 0.4 MG SL SUBL: 0.4000 mg | SUBLINGUAL_TABLET | SUBLINGUAL | Status: DC | PRN

## 2012-08-17 MED ORDER — METOPROLOL TARTRATE 25 MG PO TABS: 25.0000 mg | ORAL_TABLET | Freq: Two times a day (BID) | ORAL | Status: DC

## 2012-08-17 MED ORDER — ATORVASTATIN CALCIUM 80 MG PO TABS: 80.0000 mg | ORAL_TABLET | Freq: Every day | ORAL | Status: DC

## 2012-08-17 MED ORDER — FENOFIBRATE 160 MG PO TABS: 160.0000 mg | ORAL_TABLET | Freq: Every day | ORAL | Status: DC

## 2012-08-17 MED ORDER — PRASUGREL HCL 10 MG PO TABS: 10.0000 mg | ORAL_TABLET | Freq: Every day | ORAL | Status: DC

## 2012-08-17 NOTE — Telephone Encounter (Signed)
Fernando Nelson called to notify office of recent pharmacy changes from [facility] to [facility] in [place] on [address]. His Insurance also asked to have refills for 90 days for proper coverage of all cardiac medications. 90day with 3 refills were sent in for  Effient, Metoprolol, Atorvastatin, Nitro, Fenofibrate   Shiori Adcox CMA

## 2013-07-21 ENCOUNTER — Other Ambulatory Visit: Payer: Self-pay | Admitting: Endocrinology

## 2013-09-23 ENCOUNTER — Other Ambulatory Visit: Payer: Self-pay | Admitting: Cardiovascular Disease

## 2013-09-23 ENCOUNTER — Telehealth: Payer: Self-pay | Admitting: Internal Medicine

## 2013-09-23 ENCOUNTER — Other Ambulatory Visit: Payer: Self-pay | Admitting: *Deleted

## 2013-09-23 DIAGNOSIS — D72829 Elevated white blood cell count, unspecified: Secondary | ICD-10-CM

## 2013-09-23 NOTE — Telephone Encounter (Signed)
C/D 09/23/13 for appt. 09/24/13 °

## 2013-09-23 NOTE — Telephone Encounter (Signed)
NEW PATIENT APPT 02/03 @ 1:30 W/DR. MOHAMED.  REFERRING DR. Sharlot GowdaW. RANDALL HARRIS DX- PERSISTENT LEUKOCYTOSIS

## 2013-09-24 ENCOUNTER — Other Ambulatory Visit (HOSPITAL_COMMUNITY)
Admission: RE | Admit: 2013-09-24 | Discharge: 2013-09-24 | Disposition: A | Discharge status: Home / Self Care | Payer: Managed Care, Other (non HMO) | Source: Ambulatory Visit | Attending: Internal Medicine | Admitting: Internal Medicine

## 2013-09-24 ENCOUNTER — Other Ambulatory Visit (HOSPITAL_BASED_OUTPATIENT_CLINIC_OR_DEPARTMENT_OTHER): Payer: Managed Care, Other (non HMO)

## 2013-09-24 ENCOUNTER — Ambulatory Visit: Payer: Managed Care, Other (non HMO)

## 2013-09-24 ENCOUNTER — Encounter: Payer: Self-pay | Admitting: Internal Medicine

## 2013-09-24 ENCOUNTER — Telehealth: Payer: Self-pay | Admitting: Internal Medicine

## 2013-09-24 ENCOUNTER — Other Ambulatory Visit: Payer: Self-pay | Admitting: Internal Medicine

## 2013-09-24 ENCOUNTER — Ambulatory Visit (HOSPITAL_BASED_OUTPATIENT_CLINIC_OR_DEPARTMENT_OTHER): Payer: Managed Care, Other (non HMO) | Admitting: Internal Medicine

## 2013-09-24 VITALS — BP 127/84 | HR 92 | Temp 98.0°F | Resp 18 | Ht 66.0 in | Wt 208.8 lb

## 2013-09-24 DIAGNOSIS — Z87898 Personal history of other specified conditions: Secondary | ICD-10-CM | POA: Insufficient documentation

## 2013-09-24 DIAGNOSIS — D72829 Elevated white blood cell count, unspecified: Secondary | ICD-10-CM

## 2013-09-24 DIAGNOSIS — D72820 Lymphocytosis (symptomatic): Secondary | ICD-10-CM

## 2013-09-24 LAB — COMPREHENSIVE METABOLIC PANEL (CC13)
ALBUMIN: 3.7 g/dL (ref 3.5–5.0)
ALT: 25 U/L (ref 0–55)
AST: 12 U/L (ref 5–34)
Alkaline Phosphatase: 116 U/L (ref 40–150)
Anion Gap: 10 mEq/L (ref 3–11)
BUN: 16.6 mg/dL (ref 7.0–26.0)
CALCIUM: 10 mg/dL (ref 8.4–10.4)
CHLORIDE: 102 meq/L (ref 98–109)
CO2: 27 mEq/L (ref 22–29)
Creatinine: 0.8 mg/dL (ref 0.7–1.3)
GLUCOSE: 227 mg/dL — AB (ref 70–140)
POTASSIUM: 4.4 meq/L (ref 3.5–5.1)
SODIUM: 138 meq/L (ref 136–145)
TOTAL PROTEIN: 7.7 g/dL (ref 6.4–8.3)
Total Bilirubin: 0.23 mg/dL (ref 0.20–1.20)

## 2013-09-24 LAB — CBC WITH DIFFERENTIAL/PLATELET
BASO%: 0.3 % (ref 0.0–2.0)
BASOS ABS: 0 10*3/uL (ref 0.0–0.1)
EOS ABS: 0.1 10*3/uL (ref 0.0–0.5)
EOS%: 0.7 % (ref 0.0–7.0)
HCT: 45.8 % (ref 38.4–49.9)
HEMOGLOBIN: 15.1 g/dL (ref 13.0–17.1)
LYMPH#: 4.6 10*3/uL — AB (ref 0.9–3.3)
LYMPH%: 41.3 % (ref 14.0–49.0)
MCH: 28.1 pg (ref 27.2–33.4)
MCHC: 33.1 g/dL (ref 32.0–36.0)
MCV: 85 fL (ref 79.3–98.0)
MONO#: 0.8 10*3/uL (ref 0.1–0.9)
MONO%: 7.1 % (ref 0.0–14.0)
NEUT#: 5.6 10*3/uL (ref 1.5–6.5)
NEUT%: 50.6 % (ref 39.0–75.0)
Platelets: 233 10*3/uL (ref 140–400)
RBC: 5.38 10*6/uL (ref 4.20–5.82)
RDW: 13.9 % (ref 11.0–14.6)
WBC: 11.1 10*3/uL — AB (ref 4.0–10.3)

## 2013-09-24 LAB — LACTATE DEHYDROGENASE (CC13): LDH: 134 U/L (ref 125–245)

## 2013-09-24 LAB — TECHNOLOGIST REVIEW

## 2013-09-24 NOTE — Telephone Encounter (Signed)
add to previous note ct to be done soon - expected date 2/5. lb.fu in may. pt given schedule.

## 2013-09-24 NOTE — Telephone Encounter (Signed)
gv pt appt schedule for may. central will call w/ct appt. pt aware.  °

## 2013-09-24 NOTE — Patient Instructions (Signed)
Followup visit in 3 months with repeat CBC and LDH. CT scan of the chest to evaluate the pulmonary nodules seen on previous scan.

## 2013-09-24 NOTE — Progress Notes (Signed)
[facility] [phone]   [phone]  CONSULT NOTE  REFERRING PHYSICIAN: Dr. Johny BlamerWilliam Harris  REASON FOR CONSULTATION:  53 years old white male with leukocytosis.  HPI Fernando Nelson is a 53 y.o. male was past medical history significant for coronary artery disease, dyslipidemia, diabetes mellitus and long history of smoking but quit 3 years ago. The patient was seen recently by his primary care physician for evaluation of chest congestion and flulike symptoms. CBC on 09/03/2013 showed elevated white blood count of 11.7 with absolute lymphocyte count of 5000. He has normal hemoglobin and hematocrit as well as platelets count. The CBC on 05/20/2013 showed elevated white blood count 13.1 with absolute lymphocyte count of 7000. CBC a year earlier on 05/30/2011 showed normal white blood count of 7.0 with absolute lymphocyte count of 2500. Because of the recent abnormalities with leukocytosis and lymphocytosis the patient was referred to me today for further evaluation and recommendation regarding his condition. The patient is feeling fine today with no specific complaints. He denied having any significant bleeding issues, bruises or ecchymosis. He intentionally lost around 20 pounds in recently but no significant night sweats. He denied having any chest pain, shortness breath, cough or hemoptysis. He has no recent treatment with steroids and he does not have any rheumatologic disorder.  Previous imaging studies from 2012 including CT scan of the chest showed questionable pulmonary nodules as well as mediastinal lymphadenopathy but no followup CT was performed. Family history significant for maternal grandfather, maternal aunt in and father with colon cancer, sister had breast cancer. Mother died from congestive heart failure at age 52. The patient is married and has 4 grown children. 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 He has a  history of smoking for more than 3 years but quit 3 years ago. HPI  Past Medical History  Diagnosis Date  . Hyperlipemia   . Asthma     "multiple times; last time 06/2011" (03/23/12)  . Type II diabetes mellitus   . Hepatitis B ~ 2000    "donated blood to [employer]; got letter; had shots over 6 months; gone now"  . CAD (coronary artery disease)     a. [place] 02[place] - s/p PTCA/Evolve II study stent to RCA 03/23/12, residual LAD dz    Past Surgical History  Procedure Laterality Date  . Coronary angioplasty with stent placement  03/23/12    "1; first one ever"  . Shoulder open rotator cuff repair  2005    left    Family History  Problem Relation Age of Onset  . Heart failure Mother     Social History History  Substance Use Topics  . Smoking status: Former Smoker -- 1.00 packs/day for 25 years    Types: Cigarettes    Quit date: 09/22/2009  . Smokeless tobacco: Never Used  . Alcohol Use: No    No Known Allergies  Current Outpatient Prescriptions  Medication Sig Dispense Refill  . aspirin EC 81 MG tablet Take 81 mg by mouth every morning.      Marland Kitchen. atorvastatin (LIPITOR) 80 MG tablet Take 1 tablet (80 mg total) by mouth at bedtime.  90 tablet  3  . INVOKAMET [phone] MG TABS Take 2 tablets by mouth daily.      Marland Kitchen. LANTUS SOLOSTAR 100 UNIT/ML Solostar Pen Inject 40 Units into the skin daily. Every morning      . metoprolol tartrate (LOPRESSOR) 25 MG tablet  Take 1 tablet (25 mg total) by mouth 2 (two) times daily.  180 tablet  3  . Multiple Vitamin (MULTIVITAMIN WITH MINERALS) TABS Take 1 tablet by mouth daily.      Marland Kitchen NOVOLOG FLEXPEN 100 UNIT/ML FlexPen Inject 20 Units into the skin. Before or after meals, sliding scale      . prasugrel (EFFIENT) 10 MG TABS Take 1 tablet (10 mg total) by mouth daily.  90 tablet  3  . nitroGLYCERIN (NITROSTAT) 0.4 MG SL tablet Place 1 tablet (0.4 mg total) under the tongue every 5 (five) minutes x 3 doses as needed for chest pain.  25 tablet  4   No  current facility-administered medications for this visit.    Review of Systems  Constitutional: negative Eyes: negative Ears, nose, mouth, throat, and face: negative Respiratory: negative Cardiovascular: negative Gastrointestinal: negative Genitourinary:negative Integument/breast: negative Hematologic/lymphatic: negative Musculoskeletal:negative Neurological: negative Behavioral/Psych: negative Endocrine: negative Allergic/Immunologic: negative  Physical Exam  ZOX:WRUEA, healthy, no distress, well nourished and well developed SKIN: skin color, texture, turgor are normal, no rashes or significant lesions HEAD: Normocephalic, No masses, lesions, tenderness or abnormalities EYES: normal, PERRLA EARS: External ears normal, Canals clear OROPHARYNX:no exudate, no erythema and lips, buccal mucosa, and tongue normal  NECK: supple, no adenopathy, no JVD LYMPH:  no palpable lymphadenopathy, no hepatosplenomegaly LUNGS: clear to auscultation , and palpation HEART: regular rate & rhythm and no murmurs ABDOMEN:abdomen soft, non-tender, normal bowel sounds and no masses or organomegaly BACK: Back symmetric, no curvature., No CVA tenderness EXTREMITIES:no joint deformities, effusion, or inflammation, no edema, no skin discoloration  NEURO: alert & oriented x 3 with fluent speech, no focal motor/sensory deficits  PERFORMANCE STATUS: ECOG 1  LABORATORY DATA: Lab Results  Component Value Date   WBC 11.1* 09/24/2013   HGB 15.1 09/24/2013   HCT 45.8 09/24/2013   MCV 85.0 09/24/2013   PLT 233 09/24/2013      Chemistry      Component Value Date/Time   NA 138 09/24/2013 1334   NA 138 05/08/2012 0936   K 4.4 09/24/2013 1334   K 4.1 05/08/2012 0936   CL 103 05/08/2012 0936   CO2 27 09/24/2013 1334   CO2 21 05/08/2012 0936   BUN 16.6 09/24/2013 1334   BUN 16 05/08/2012 0936   CREATININE 0.8 09/24/2013 1334   CREATININE 0.9 05/08/2012 0936      Component Value Date/Time   CALCIUM 10.0 09/24/2013 1334    CALCIUM 8.9 05/08/2012 0936   ALKPHOS 116 09/24/2013 1334   ALKPHOS 53 05/08/2012 0936   AST 12 09/24/2013 1334   AST 20 05/08/2012 0936   ALT 25 09/24/2013 1334   ALT 28 05/08/2012 0936   BILITOT 0.23 09/24/2013 1334   BILITOT 0.3 05/08/2012 0936       RADIOGRAPHIC STUDIES: No results found.  ASSESSMENT: This is a very pleasant 53 years old white male presented for evaluation of mild leukocytosis and lymphocytosis questionable for chronic lymphocytic leukemia.   PLAN: I have a lengthy discussion with the patient today about his current condition. I ordered several studies today to evaluate his condition including repeat CBC, comprehensive metabolic panel and LDH as well as flow cytometry of the peripheral blood. If the flow cytometry confirms the diagnosis of chronic lymphocytic leukemia, the patient would have a diagnosis of a stage 0 and he will continue on observation. I would see him back for followup visit in 3 months with repeat CBC and LDH. For the history of  pulmonary nodules and mediastinal lymphadenopathy, I would consider the patient for repeat CT scan of the chest to confirm resolution of these abnormalities. He was advised to call immediately if he has any concerning symptoms in the interval.  The patient voices understanding of current disease status and treatment options and is in agreement with the current care plan.  All questions were answered. The patient knows to call the clinic with any problems, questions or concerns. We can certainly see the patient much sooner if necessary.  Thank you so much for allowing me to participate in the care of STACEY MAURA. I will continue to follow up the patient with you and assist in his care.  I spent 40 minutes counseling the patient face to face. The total time spent in the appointment was 55 minutes.  Disclaimer: This note was dictated with voice recognition software. Similar sounding words can inadvertently be transcribed and may not  be corrected upon review.   Najai Waszak K. 09/24/2013, 3:45 PM

## 2013-09-26 ENCOUNTER — Ambulatory Visit (HOSPITAL_COMMUNITY)
Admission: RE | Admit: 2013-09-26 | Discharge: 2013-09-26 | Disposition: A | Discharge status: Home / Self Care | Payer: Managed Care, Other (non HMO) | Source: Ambulatory Visit | Attending: Internal Medicine | Admitting: Internal Medicine

## 2013-09-26 ENCOUNTER — Encounter (HOSPITAL_COMMUNITY): Payer: Self-pay

## 2013-09-26 DIAGNOSIS — Z87898 Personal history of other specified conditions: Secondary | ICD-10-CM

## 2013-09-26 DIAGNOSIS — J984 Other disorders of lung: Secondary | ICD-10-CM | POA: Insufficient documentation

## 2013-09-26 DIAGNOSIS — R911 Solitary pulmonary nodule: Secondary | ICD-10-CM | POA: Insufficient documentation

## 2013-09-26 MED ORDER — IOHEXOL 300 MG/ML  SOLN
80.0000 mL | Freq: Once | INTRAMUSCULAR | Status: AC | PRN
  Administered 2013-09-26: 80 mL via INTRAVENOUS

## 2013-09-27 ENCOUNTER — Encounter: Payer: Self-pay | Admitting: Internal Medicine

## 2013-09-27 LAB — FLOW CYTOMETRY

## 2013-10-01 ENCOUNTER — Telehealth: Payer: Self-pay | Admitting: *Deleted

## 2013-10-01 ENCOUNTER — Other Ambulatory Visit: Payer: Self-pay | Admitting: Physician Assistant

## 2013-10-01 NOTE — Telephone Encounter (Signed)
VERBAL ORDER AND READ BACK TO DR.MOHAMED- THE CURRENT TEST RESULTS DO NOT MEET THE CRITERIA FOR CLL. PT. TO CONTINUE OBSERVATION AND FOLLOW UP IN THREE MONTHS WITH A REPEAT CBC AND LDH. NOTIFIED PT. OF THE ABOVE INFORMATION AND REMINDED HIM OF HIS APPOINTMENT ON 12/24/13. PT. VOICES UNDERSTANDING.

## 2013-10-04 ENCOUNTER — Encounter: Payer: Self-pay | Admitting: Cardiovascular Disease

## 2013-10-04 ENCOUNTER — Ambulatory Visit (INDEPENDENT_AMBULATORY_CARE_PROVIDER_SITE_OTHER): Payer: Managed Care, Other (non HMO) | Admitting: Cardiovascular Disease

## 2013-10-04 VITALS — BP 100/76 | HR 81 | Ht 66.0 in | Wt 211.8 lb

## 2013-10-04 DIAGNOSIS — I2510 Atherosclerotic heart disease of native coronary artery without angina pectoris: Secondary | ICD-10-CM

## 2013-10-04 DIAGNOSIS — E785 Hyperlipidemia, unspecified: Secondary | ICD-10-CM

## 2013-10-04 LAB — LIPID PANEL
CHOL/HDL RATIO: 6
CHOLESTEROL: 198 mg/dL (ref 0–200)
HDL: 33.8 mg/dL — AB (ref >39.00)
Triglycerides: 607 mg/dL — ABNORMAL HIGH (ref 0.0–149.0)
VLDL: 121.4 mg/dL — AB (ref 0.0–40.0)

## 2013-10-04 LAB — LDL CHOLESTEROL, DIRECT: Direct LDL: 85.2 mg/dL

## 2013-10-04 MED ORDER — METOPROLOL TARTRATE 25 MG PO TABS: 25.0000 mg | ORAL_TABLET | Freq: Two times a day (BID) | ORAL | Status: DC

## 2013-10-04 MED ORDER — NITROGLYCERIN 0.4 MG SL SUBL: 0.4000 mg | SUBLINGUAL_TABLET | SUBLINGUAL | Status: DC | PRN

## 2013-10-04 MED ORDER — PRASUGREL HCL 10 MG PO TABS: 10.0000 mg | ORAL_TABLET | Freq: Every day | ORAL | Status: DC

## 2013-10-04 NOTE — Assessment & Plan Note (Signed)
He has not had any angina.    Continue current meds  

## 2013-10-04 NOTE — Assessment & Plan Note (Addendum)
He [facility] that [facility]  But did not include.  Lipids.  Will draw today.    I am not able to find them.  Dr. 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

## 2013-10-04 NOTE — Patient Instructions (Addendum)
PLEASE REMEMBER TO SEND US A COPY OF YOUR LABS. OUR FAX NUMBER IS [phone]  Your physician wants you to follow-up in: 1 YEAR WITH DR. Elease HashimotoNAHSER You will receive a reminder letter in the mail two months in advance. If you don't receive a letter, please call our office to schedule the follow-up appointment.  Your physician recommends that you return for lab work in: TODAY (LIPIDS)  Your physician recommends that you continue on your current medications as directed. Please refer to the Current Medication list given to you today.  WE REFILLED YOUR METOPROLOL, EFFIENT AND NITROGLYCERIN TODAY

## 2013-10-04 NOTE — Progress Notes (Signed)
Terrilyn SaverWillem E Romano Date of Birth  11-25-1960       [facility]    [place] [facility] [address]  [address] [place], [place]  [address]   [place], [place]  [address] [phone]     [phone]   Fax  [phone]    Fax [phone]  Problem List: 1. Coronary Artery disease- s/p 3.5 EVOLVE II study stent by 20 mm to the RCA 2. Diabetes Mellitus 3. Hyperlipidemia / hypertriglyceridemia   History of Present Illness:  Fernando Nelson is a 53 yo with a recent episode of CP. He was admitted with unstable angina. He was found to have a tight right coronary artery stenosis and had placement of a 3.5 mm drug-eluting stent to the RCA.  He has had twinges of chest pain that last only several seconds.  He is exercising 4 times a week.  Feb. 13, 2015:  Fernando Nelson is doing very well. He is exercising without any chest pains or shortness breath. He is enrolled in the EVOLVE  II stent study.  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  Dr. Sharl MaKerr is following his lipid levels.  Current Outpatient Prescriptions on File Prior to Visit  Medication Sig Dispense Refill  . aspirin EC 81 MG tablet Take 81 mg by mouth every morning.      Marland Kitchen. atorvastatin (LIPITOR) 80 MG tablet Take 1 tablet (80 mg total) by mouth at bedtime.  90 tablet  3  . EFFIENT 10 MG TABS tablet TAKE 1 TABLET BY MOUTH DAILY  30 tablet  0  . INVOKAMET [phone] MG TABS Take 2 tablets by mouth daily.      Marland Kitchen. LANTUS SOLOSTAR 100 UNIT/ML Solostar Pen Inject 40 Units into the skin daily. Every morning      . metoprolol tartrate (LOPRESSOR) 25 MG tablet Take 1 tablet (25 mg total) by mouth 2 (two) times daily.  180 tablet  3  . Multiple Vitamin (MULTIVITAMIN WITH MINERALS) TABS Take 1 tablet by mouth daily.      . nitroGLYCERIN (NITROSTAT) 0.4 MG SL tablet Place 1 tablet (0.4 mg total) under the tongue every 5 (five) minutes x 3 doses as needed for chest pain.  25 tablet  4  . NOVOLOG FLEXPEN 100 UNIT/ML FlexPen  Inject 20 Units into the skin. Before or after meals, sliding scale       No current facility-administered medications on file prior to visit.    No Known Allergies  Past Medical History  Diagnosis Date  . Hyperlipemia   . Asthma     "multiple times; last time 06/2011" (03/23/12)  . Type II diabetes mellitus   . Hepatitis B ~ 2000    "donated blood to [facility]; got letter; had shots over 6 months; gone now"  . CAD (coronary artery disease)     a. [place] 02[place] - s/p PTCA/Evolve II study stent to RCA 03/23/12, residual LAD dz    Past Surgical History  Procedure Laterality Date  . Coronary angioplasty with stent placement  03/23/12    "1; first one ever"  . Shoulder open rotator cuff repair  2005    left    History  Smoking status  . Former Smoker -- 1.00 packs/day for 25 years  . Types: Cigarettes  . Quit date: 09/22/2009  Smokeless tobacco  . Never Used    History  Alcohol Use No    Family History  Problem Relation Age  of Onset  . Heart failure Mother     Reviw of Systems:  Reviewed in the HPI.  All other systems are negative.  Physical Exam: Blood pressure 100/76, pulse 81, height 5\' 6"  (1.676 m), weight 211 lb 12.8 oz (96.072 kg). General: Well developed, well nourished, in no acute distress.  Head: Normocephalic, atraumatic, sclera non-icteric, mucus membranes are moist,   Neck: Supple. Carotids are 2 + without bruits. No JVD  Lungs: Clear bilaterally to auscultation.  Heart: regular rate.  normal  S1 S2. No murmurs, gallops or rubs.  Abdomen: Soft, non-tender, non-distended with normal bowel sounds. No hepatomegaly. No rebound/guarding. No masses.  Msk:  Strength and tone are normal  Extremities: No clubbing or cyanosis. No edema.  Distal pedal pulses are 2+  Neuro: Alert and oriented X 3. Moves all extremities spontaneously.  Psych:  Responds to questions appropriately with a normal affect.  ECG: 10/04/2013: Normal sinus rhythm at 81. He has no ST  or T wave changes. Assessment / Plan:

## 2013-10-25 ENCOUNTER — Other Ambulatory Visit: Payer: Self-pay | Admitting: Cardiovascular Disease

## 2013-12-17 ENCOUNTER — Telehealth: Payer: Self-pay | Admitting: Internal Medicine

## 2013-12-17 NOTE — Telephone Encounter (Signed)
returned pt call adn r/s appt per pt request....pt aware of new d.t °

## 2013-12-24 ENCOUNTER — Ambulatory Visit: Payer: Managed Care, Other (non HMO) | Admitting: Internal Medicine

## 2013-12-24 ENCOUNTER — Other Ambulatory Visit: Payer: Managed Care, Other (non HMO)

## 2013-12-30 ENCOUNTER — Telehealth: Payer: Self-pay | Admitting: Medical Oncology

## 2013-12-30 NOTE — Telephone Encounter (Signed)
Pt cancelled appt for tomorrow due to death in family and said he will call back to r/s

## 2013-12-31 ENCOUNTER — Other Ambulatory Visit: Payer: Managed Care, Other (non HMO)

## 2013-12-31 ENCOUNTER — Ambulatory Visit: Payer: Managed Care, Other (non HMO) | Admitting: Internal Medicine

## 2014-02-15 ENCOUNTER — Other Ambulatory Visit: Payer: Self-pay | Admitting: Cardiovascular Disease

## 2014-07-31 ENCOUNTER — Encounter (HOSPITAL_COMMUNITY): Payer: Self-pay | Admitting: Internal Medicine

## 2014-10-08 ENCOUNTER — Other Ambulatory Visit: Payer: Self-pay | Admitting: Cardiovascular Disease

## 2014-10-08 ENCOUNTER — Other Ambulatory Visit: Payer: Self-pay | Admitting: Endocrinology

## 2015-01-17 ENCOUNTER — Encounter (HOSPITAL_BASED_OUTPATIENT_CLINIC_OR_DEPARTMENT_OTHER): Payer: Self-pay

## 2015-01-17 ENCOUNTER — Emergency Department (HOSPITAL_BASED_OUTPATIENT_CLINIC_OR_DEPARTMENT_OTHER)
Admission: EM | Admit: 2015-01-17 | Discharge: 2015-01-17 | Disposition: A | Discharge status: Home / Self Care | Payer: Managed Care, Other (non HMO) | Attending: Emergency Medicine | Admitting: Emergency Medicine

## 2015-01-17 DIAGNOSIS — Z7982 Long term (current) use of aspirin: Secondary | ICD-10-CM | POA: Insufficient documentation

## 2015-01-17 DIAGNOSIS — E785 Hyperlipidemia, unspecified: Secondary | ICD-10-CM | POA: Insufficient documentation

## 2015-01-17 DIAGNOSIS — K0889 Other specified disorders of teeth and supporting structures: Secondary | ICD-10-CM

## 2015-01-17 DIAGNOSIS — K088 Other specified disorders of teeth and supporting structures: Secondary | ICD-10-CM | POA: Diagnosis present

## 2015-01-17 DIAGNOSIS — I2510 Atherosclerotic heart disease of native coronary artery without angina pectoris: Secondary | ICD-10-CM | POA: Insufficient documentation

## 2015-01-17 DIAGNOSIS — Z9889 Other specified postprocedural states: Secondary | ICD-10-CM | POA: Diagnosis not present

## 2015-01-17 DIAGNOSIS — E119 Type 2 diabetes mellitus without complications: Secondary | ICD-10-CM | POA: Diagnosis not present

## 2015-01-17 DIAGNOSIS — Z8619 Personal history of other infectious and parasitic diseases: Secondary | ICD-10-CM | POA: Diagnosis not present

## 2015-01-17 DIAGNOSIS — Z9861 Coronary angioplasty status: Secondary | ICD-10-CM | POA: Insufficient documentation

## 2015-01-17 DIAGNOSIS — Z794 Long term (current) use of insulin: Secondary | ICD-10-CM | POA: Insufficient documentation

## 2015-01-17 DIAGNOSIS — Z87891 Personal history of nicotine dependence: Secondary | ICD-10-CM | POA: Diagnosis not present

## 2015-01-17 DIAGNOSIS — Z79899 Other long term (current) drug therapy: Secondary | ICD-10-CM | POA: Diagnosis not present

## 2015-01-17 MED ORDER — AMOXICILLIN-POT CLAVULANATE 875-125 MG PO TABS: 1.0000 | ORAL_TABLET | Freq: Two times a day (BID) | ORAL | Status: DC

## 2015-01-17 MED ORDER — TRAMADOL HCL 50 MG PO TABS: 50.0000 mg | ORAL_TABLET | Freq: Four times a day (QID) | ORAL | Status: DC | PRN

## 2015-01-17 NOTE — ED Provider Notes (Signed)
CSN: [id number]     Arrival date & time 01/17/15  1708 History   This chart was scribed for Gilda Creasehristopher J Pollina, MD by Evon Slackerrance Branch, ED Scribe. This patient was seen in room MH11/MH11 and the patient's care was started at 5:18 PM.    Chief Complaint  Patient presents with  . Dental Pain   The history is provided by the patient. No language interpreter was used.   HPI Comments: Fernando Nelson is a 54 y.o. male who presents to the Emergency Department complaining of right upper dental pain onset 4 days prior. Pt states that the pain radiates down to his lower teeth. Pt states that the pain is worse when eating. Pt denies fever or other related symptoms.   Past Medical History  Diagnosis Date  . Hyperlipemia   . Type II diabetes mellitus   . Hepatitis B ~ 2000    "donated blood to [facility]; got letter; had shots over 6 months; gone now"  . CAD (coronary artery disease)     a. [place] 02[place]2012 - s/p PTCA/Evolve II study stent to RCA 03/23/12, residual LAD dz   Past Surgical History  Procedure Laterality Date  . Coronary angioplasty with stent placement  03/23/12    "1; first one ever"  . Shoulder open rotator cuff repair  2005    left  . Left heart catheterization with coronary angiogram N/A 03/23/2012    Procedure: LEFT HEART CATHETERIZATION WITH CORONARY ANGIOGRAM;  Surgeon: Dolores Pattyaniel R Bensimhon, MD;  [facility];  Service: Cardiovascular;  Laterality: N/A;  . Percutaneous coronary stent intervention (pci-s)  03/23/2012    Procedure: PERCUTANEOUS CORONARY STENT INTERVENTION (PCI-S);  Surgeon: Dolores Pattyaniel R Bensimhon, MD;  [facility];  Service: Cardiovascular;;   Family History  Problem Relation Age of Onset  . Heart failure Mother    History  Substance Use Topics  . Smoking status: Former Smoker -- 1.00 packs/day for 25 years    Types: Cigarettes    Quit date: 09/22/2009  . Smokeless tobacco: Never Used  . Alcohol Use: No    Review of Systems  Constitutional:  Negative for fever.  HENT: Positive for dental problem.   All other systems reviewed and are negative.    Allergies  Review of patient's allergies indicates no known allergies.  Home Medications   Prior to Admission medications   Medication Sig Start Date End Date Taking? Authorizing Provider  amoxicillin-clavulanate (AUGMENTIN) 875-125 MG per tablet Take 1 tablet by mouth every 12 (twelve) hours. 01/17/15   Gilda Creasehristopher J Pollina, MD  aspirin EC 81 MG tablet Take 81 mg by mouth every morning.    Historical Provider, MD  atorvastatin (LIPITOR) 80 MG tablet TAKE 1 TABLET BY MOUTH AT BEDTIME    Vesta MixerPhilip J Nahser, MD  EFFIENT 10 MG TABS tablet TAKE 1 TABLET BY MOUTH DAILY 10/10/14   Vesta MixerPhilip J Nahser, MD  INVOKAMET [phone] MG TABS Take 2 tablets by mouth daily. 09/06/13   Historical Provider, MD  LANTUS SOLOSTAR 100 UNIT/ML Solostar Pen Inject 40 Units into the skin daily. Every morning 06/17/13   Historical Provider, MD  metoprolol tartrate (LOPRESSOR) 25 MG tablet TAKE 1 TABLET BY MOUTH TWICE DAILY 10/10/14   Vesta MixerPhilip J Nahser, MD  Multiple Vitamin (MULTIVITAMIN WITH MINERALS) TABS Take 1 tablet by mouth daily.    Historical Provider, MD  nitroGLYCERIN (NITROSTAT) 0.4 MG SL tablet Place 1 tablet (0.4 mg total) under the tongue every 5 (five) minutes x  3 doses as needed for chest pain. 10/04/13 11/21/14  Vesta Mixer, MD  NOVOLOG FLEXPEN 100 UNIT/ML FlexPen Inject 20 Units into the skin. Before or after meals, sliding scale 08/27/13   Historical Provider, MD  traMADol (ULTRAM) 50 MG tablet Take 1 tablet (50 mg total) by mouth every 6 (six) hours as needed. 01/17/15   Gilda Crease, MD   BP 145/69 mmHg  Pulse 105  Temp(Src) 98.1 F (36.7 C) (Oral)  Resp 18  Ht  (1.702 m)  Wt 210 lb (95.255 kg)  BMI 32.88 kg/m2  SpO2 99%   Physical Exam  Constitutional: He is oriented to person, place, and time. He appears well-developed and well-nourished. No distress.  HENT:  Head: Normocephalic  and atraumatic.  Right Ear: Hearing normal.  Left Ear: Hearing normal.  Nose: Nose normal.  Mouth/Throat: Oropharynx is clear and moist and mucous membranes are normal.  Tenderness to right upper molars, no significant swelling.   Eyes: Conjunctivae and EOM are normal. Pupils are equal, round, and reactive to light.  Neck: Normal range of motion. Neck supple.  Cardiovascular: Regular rhythm, S1 normal and S2 normal.  Exam reveals no gallop and no friction rub.   No murmur heard. Pulmonary/Chest: Effort normal and breath sounds normal. No respiratory distress. He exhibits no tenderness.  Abdominal: Soft. Normal appearance and bowel sounds are normal. There is no hepatosplenomegaly. There is no tenderness. There is no rebound, no guarding, no tenderness at McBurney's point and negative Murphy's sign. No hernia.  Musculoskeletal: Normal range of motion.  Neurological: He is alert and oriented to person, place, and time. He has normal strength. No cranial nerve deficit or sensory deficit. Coordination normal. GCS eye subscore is 4. GCS verbal subscore is 5. GCS motor subscore is 6.  Skin: Skin is warm, dry and intact. No rash noted. No cyanosis.  Psychiatric: He has a normal mood and affect. His speech is normal and behavior is normal. Thought content normal.  Nursing note and vitals reviewed.   ED Course  Procedures (including critical care time) DIAGNOSTIC STUDIES: Oxygen Saturation is 99% on RA, normal by my interpretation.    COORDINATION OF CARE: 5:35 PM-Discussed treatment plan with pt at bedside and pt agreed to plan.     Labs Review Labs Reviewed - No data to display  Imaging Review No results found.   EKG Interpretation None      MDM   Final diagnoses:  Toothache      I personally performed the services described in this documentation, which was scribed in my presence. The recorded information has been reviewed and is accurate.      Gilda Crease,  MD 01/17/15 [phone]

## 2015-01-17 NOTE — ED Notes (Signed)
Pt reports 3-4 days of right upper dental pain, no swelling or fevers.

## 2015-01-17 NOTE — Discharge Instructions (Signed)

## 2015-02-06 ENCOUNTER — Ambulatory Visit
Admission: RE | Admit: 2015-02-06 | Discharge: 2015-02-06 | Disposition: A | Discharge status: Home / Self Care | Payer: 59 | Source: Ambulatory Visit | Attending: Family Medicine | Admitting: Family Medicine

## 2015-02-06 ENCOUNTER — Other Ambulatory Visit: Payer: Self-pay | Admitting: Family Medicine

## 2015-02-06 DIAGNOSIS — R05 Cough: Secondary | ICD-10-CM

## 2015-02-06 DIAGNOSIS — R059 Cough, unspecified: Secondary | ICD-10-CM

## 2015-02-16 ENCOUNTER — Other Ambulatory Visit: Payer: Self-pay

## 2015-02-24 ENCOUNTER — Other Ambulatory Visit: Payer: Self-pay | Admitting: Family Medicine

## 2015-02-24 DIAGNOSIS — R911 Solitary pulmonary nodule: Secondary | ICD-10-CM

## 2015-02-27 ENCOUNTER — Ambulatory Visit
Admission: RE | Admit: 2015-02-27 | Discharge: 2015-02-27 | Disposition: A | Discharge status: Home / Self Care | Payer: 59 | Source: Ambulatory Visit | Attending: Family Medicine | Admitting: Family Medicine

## 2015-02-27 DIAGNOSIS — R911 Solitary pulmonary nodule: Secondary | ICD-10-CM

## 2015-02-27 MED ORDER — IOPAMIDOL (ISOVUE-300) INJECTION 61%
75.0000 mL | Freq: Once | INTRAVENOUS | Status: AC | PRN
  Administered 2015-02-27: 75 mL via INTRAVENOUS

## 2015-07-14 ENCOUNTER — Ambulatory Visit (INDEPENDENT_AMBULATORY_CARE_PROVIDER_SITE_OTHER): Payer: Managed Care, Other (non HMO) | Admitting: Cardiovascular Disease

## 2015-07-14 ENCOUNTER — Encounter: Payer: Self-pay | Admitting: Cardiovascular Disease

## 2015-07-14 VITALS — BP 114/76 | HR 89 | Ht 67.0 in | Wt 217.8 lb

## 2015-07-14 DIAGNOSIS — I2510 Atherosclerotic heart disease of native coronary artery without angina pectoris: Secondary | ICD-10-CM

## 2015-07-14 DIAGNOSIS — E785 Hyperlipidemia, unspecified: Secondary | ICD-10-CM

## 2015-07-14 MED ORDER — NITROGLYCERIN 0.4 MG SL SUBL: 0.4000 mg | SUBLINGUAL_TABLET | SUBLINGUAL | Status: DC | PRN

## 2015-07-14 NOTE — Progress Notes (Signed)
Fernando SaverWillem E Nelson Date of Birth  09-16-1960       [facility]    [place] [facility] [address]  [address] [place], [place]  [address]   [place], [place]  [address] [phone]     [phone]   Fax  [phone]    Fax [phone]  Problem List: 1. Coronary Artery disease- s/p 3.5 EVOLVE II study stent by 20 mm to the RCA 2. Diabetes Mellitus 3. Hyperlipidemia / hypertriglyceridemia   History of Present Illness:  Fernando Nelson is a 54 yo with a recent episode of CP. He was admitted with unstable angina. He was found to have a tight right coronary artery stenosis and had placement of a 3.5 mm drug-eluting stent to the RCA.  He has had twinges of chest pain that last only several seconds.  He is exercising 4 times a week.  Feb. 13, 2015:  Fernando Nelson is doing very well. He is exercising without any chest pains or shortness breath. He is enrolled in the EVOLVE  II stent study.  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  Dr. Sharl MaKerr is following his lipid levels.  Nov. 22, 2016  Fernando Nelson is seen today for follow up of his CAD Doing well.  No angina  Exercising  -  Has some right sided pain , constant , not similar to his presenting pain  Will talk to his primary medical doctor   Current Outpatient Prescriptions on File Prior to Visit  Medication Sig Dispense Refill  . aspirin EC 81 MG tablet Take 81 mg by mouth every morning.    Marland Kitchen. LANTUS SOLOSTAR 100 UNIT/ML Solostar Pen Inject 40 Units into the skin daily. Every morning    . Multiple Vitamin (MULTIVITAMIN WITH MINERALS) TABS Take 1 tablet by mouth daily.    Marland Kitchen. NOVOLOG FLEXPEN 100 UNIT/ML FlexPen Inject 20 Units into the skin. Before or after meals, sliding scale    . nitroGLYCERIN (NITROSTAT) 0.4 MG SL tablet Place 1 tablet (0.4 mg total) under the tongue every 5 (five) minutes x 3 doses as needed for chest pain. 25 tablet 4   No current facility-administered medications on file prior to visit.    No  Known Allergies  Past Medical History  Diagnosis Date  . Hyperlipemia   . Type II diabetes mellitus (HCC)   . Hepatitis B ~ 2000    "donated blood to [facility]; got letter; had shots over 6 months; gone now"  . CAD (coronary artery disease)     a. [place] 02[place]2012 - s/p PTCA/Evolve II study stent to RCA 03/23/12, residual LAD dz    Past Surgical History  Procedure Laterality Date  . Coronary angioplasty with stent placement  03/23/12    "1; first one ever"  . Shoulder open rotator cuff repair  2005    left  . Left heart catheterization with coronary angiogram N/A 03/23/2012    Procedure: LEFT HEART CATHETERIZATION WITH CORONARY ANGIOGRAM;  Surgeon: Dolores Pattyaniel R Bensimhon, MD;  [facility];  Service: Cardiovascular;  Laterality: N/A;  . Percutaneous coronary stent intervention (pci-s)  03/23/2012    Procedure: PERCUTANEOUS CORONARY STENT INTERVENTION (PCI-S);  Surgeon: Dolores Pattyaniel R Bensimhon, MD;  [facility];  Service: Cardiovascular;;    History  Smoking status  . Former Smoker -- 1.00 packs/day for 25 years  . Types: Cigarettes  . Quit date: 09/22/2009  Smokeless tobacco  . Never Used  History  Alcohol Use No    Family History  Problem Relation Age of Onset  . Heart failure Mother     Reviw of Systems:  Reviewed in the HPI.  All other systems are negative.  Physical Exam: Blood pressure 114/76, pulse 89, height  (1.702 m), weight 217 lb 12.8 oz (98.793 kg). General: Well developed, well nourished, in no acute distress.  Head: Normocephalic, atraumatic, sclera non-icteric, mucus membranes are moist,   Neck: Supple. Carotids are 2 + without bruits. No JVD  Lungs: Clear bilaterally to auscultation.  Heart: regular rate.  normal  S1 S2. No murmurs, gallops or rubs.  Abdomen: Soft, non-tender, non-distended with normal bowel sounds. No hepatomegaly. No rebound/guarding. No masses.  Msk:  Strength and tone are normal  Extremities: No clubbing or cyanosis.  No edema.  Distal pedal pulses are 2+  Neuro: Alert and oriented X 3. Moves all extremities spontaneously.  Psych:  Responds to questions appropriately with a normal affect.  ECG: Nov. 22, 2016 NSR at 89.  No ST or T wave changes.   Assessment / Plan:   1. Coronary Artery disease- s/p 3.5 EVOLVE II study stent by 20 mm to the RCA-  No CP , has some atypical right sided chest pain .   2. Diabetes Mellitus  3. Hyperlipidemia / hypertriglyceridemia - has run out of his atorvastatin.    I urged him to not run out of his medication .  Will be going to have his labs drawn today.      Sinead Hockman, Deloris Ping, MD  07/14/2015 8:52 AM    [facility] [address] [place],  [address] [place], [place]  [address] Pager [phone] Phone: [phone]; Fax: [phone]   [facility]  [address] [place], [place]  [address] [phone]   Fax [phone]

## 2015-07-14 NOTE — Patient Instructions (Signed)

## 2015-07-20 ENCOUNTER — Encounter: Payer: Self-pay | Admitting: Cardiovascular Disease

## 2015-10-09 ENCOUNTER — Other Ambulatory Visit: Payer: Self-pay | Admitting: Family Medicine

## 2015-10-09 DIAGNOSIS — R911 Solitary pulmonary nodule: Secondary | ICD-10-CM

## 2015-10-19 ENCOUNTER — Other Ambulatory Visit: Payer: Managed Care, Other (non HMO)

## 2015-10-23 ENCOUNTER — Ambulatory Visit
Admission: RE | Admit: 2015-10-23 | Discharge: 2015-10-23 | Disposition: A | Discharge status: Home / Self Care | Payer: 59 | Source: Ambulatory Visit | Attending: Family Medicine | Admitting: Family Medicine

## 2015-10-23 DIAGNOSIS — R911 Solitary pulmonary nodule: Secondary | ICD-10-CM

## 2016-07-13 ENCOUNTER — Encounter: Payer: Self-pay | Admitting: *Deleted

## 2016-07-22 ENCOUNTER — Ambulatory Visit: Payer: 59 | Admitting: Cardiovascular Disease

## 2016-07-25 ENCOUNTER — Encounter: Payer: Self-pay | Admitting: Cardiovascular Disease

## 2016-08-05 ENCOUNTER — Encounter: Payer: Self-pay | Admitting: Cardiovascular Disease

## 2016-08-05 ENCOUNTER — Ambulatory Visit (INDEPENDENT_AMBULATORY_CARE_PROVIDER_SITE_OTHER): Payer: 59 | Admitting: Cardiovascular Disease

## 2016-08-05 VITALS — BP 122/76 | HR 85 | Ht 67.0 in | Wt 217.4 lb

## 2016-08-05 DIAGNOSIS — I2510 Atherosclerotic heart disease of native coronary artery without angina pectoris: Secondary | ICD-10-CM

## 2016-08-05 DIAGNOSIS — E782 Mixed hyperlipidemia: Secondary | ICD-10-CM | POA: Diagnosis not present

## 2016-08-05 MED ORDER — OMEGA-3-ACID ETHYL ESTERS 1 G PO CAPS
1.0000 | ORAL_CAPSULE | Freq: Every day | ORAL | 3 refills | Status: DC
Start: 2016-08-05 — End: 2017-09-01

## 2016-08-05 MED ORDER — METOPROLOL TARTRATE 25 MG PO TABS: 25.0000 mg | ORAL_TABLET | Freq: Two times a day (BID) | ORAL | 3 refills | Status: DC

## 2016-08-05 MED ORDER — FENOFIBRATE 160 MG PO TABS: 160.0000 mg | ORAL_TABLET | Freq: Every day | ORAL | 11 refills | Status: DC

## 2016-08-05 MED ORDER — NITROGLYCERIN 0.4 MG SL SUBL: 0.4000 mg | SUBLINGUAL_TABLET | SUBLINGUAL | 6 refills | Status: DC | PRN

## 2016-08-05 NOTE — Progress Notes (Signed)
Fernando Nelson Date of Birth  1961/05/30       [facility]    [place] [facility] [address]  [address] [place], [place]  [address]   [place], [place]  [address] [phone]     [phone]   Fax  [phone]    Fax [phone]  Problem List: 1. Coronary Artery disease- s/p 3.5 EVOLVE II study stent by 20 mm to the RCA 2. Diabetes Mellitus 3. Hyperlipidemia / hypertriglyceridemia   History of Present Illness:  Fernando Nelson is a 55 yo with a recent episode of CP. He was admitted with unstable angina. He was found to have a tight right coronary artery stenosis and had placement of a 3.5 mm drug-eluting stent to the RCA.  He has had twinges of chest pain that last only several seconds.  He is exercising 4 times a week.  Feb. 13, 2015:  Fernando Nelson is doing very well. He is exercising without any chest pains or shortness breath. He is enrolled in the EVOLVE  II stent study.  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  Dr. Sharl MaKerr is following his lipid levels.  Nov. 22, 2016  Fernando Nelson is seen today for follow up of his CAD Doing well.  No angina  Exercising  -  Has some right sided pain , constant , not similar to his presenting pain  Will talk to his primary medical doctor  Dec. 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 aspirin EC 81 MG tablet Take 81 mg by mouth every morning.    . Multiple Vitamin (MULTIVITAMIN WITH MINERALS) TABS Take 1 tablet by mouth daily.    . nitroGLYCERIN (NITROSTAT) 0.4 MG SL tablet Place 1 tablet (0.4 mg total) under the tongue every 5 (five) minutes x 3 doses as needed for chest pain. 25 tablet 6   No current facility-administered medications on file prior to visit.     No Known Allergies  Past Medical History:   Diagnosis Date  . CAD (coronary artery disease)    a. [place] 02[place]2012 - s/p PTCA/Evolve II study stent to RCA 03/23/12, residual LAD dz  . Hepatitis B ~ 2000   "[facility]ated blood to [facility]; got letter; had shots over 6 months; gone now"  . Hyperlipemia   . Type II diabetes mellitus (HCC)     Past Surgical History:  Procedure Laterality Date  . CORONARY ANGIOPLASTY WITH STENT PLACEMENT  03/23/12   "1; first one ever"  . LEFT HEART CATHETERIZATION WITH CORONARY ANGIOGRAM N/A 03/23/2012   Procedure: LEFT HEART CATHETERIZATION WITH CORONARY ANGIOGRAM;  Surgeon: Dolores Pattyaniel R Bensimhon, MD;  [facility];  Service: Cardiovascular;  Laterality: N/A;  . PERCUTANEOUS CORONARY STENT INTERVENTION (PCI-S)  03/23/2012   Procedure: PERCUTANEOUS CORONARY STENT INTERVENTION (PCI-S);  Surgeon: Dolores Pattyaniel R Bensimhon, MD;  [facility];  Service: Cardiovascular;;  . SHOULDER OPEN ROTATOR CUFF REPAIR  2005   left    History  Smoking Status  . Former Smoker  . Packs/day: 1.00  . Years: 25.00  . Types: Cigarettes  . Quit date: 09/22/2009  Smokeless Tobacco  . Never Used    History  Alcohol Use No    Family History  Problem Relation Age of Onset  . Heart failure Mother     Reviw of Systems:  Reviewed in the HPI.  All other systems are negative.  Physical Exam: Blood pressure 122/76, pulse 85, height 5\' 7"  (1.702 m), weight 217 lb 6.4 oz (98.6 kg). General: Well developed, well nourished, in no acute distress.  Head: Normocephalic, atraumatic, sclera non-icteric, mucus membranes are moist,   Neck: Supple. Carotids are 2 + without bruits. No JVD  Lungs: Clear bilaterally to auscultation.  Heart: regular rate.  normal  S1 S2. No murmurs, gallops or rubs.  Abdomen: Soft, non-tender, non-distended with normal bowel sounds. No hepatomegaly. No rebound/guarding. No masses.  Msk:  Strength and tone are normal  Extremities: No clubbing or cyanosis. No edema.  Distal pedal pulses are  2+  Neuro: Alert and oriented X 3. Moves all extremities spontaneously.  Psych:  Responds to questions appropriately with a normal affect.  ECG: Nov. 22, 2016 NSR at 89.  No ST or T wave changes.   Assessment / Plan:   1. Coronary Artery disease- s/p 3.5 EVOLVE II study stent by 20 mm to the RCA-  No CP , has some atypical right sided chest pain .   2. Diabetes Mellitus  3. Hyperlipidemia / hypertriglyceridemia - has run out of his atorvastatin.    I urged him to not run out of his medication .  Will be going to have his labs drawn today.      Kristeen MissPhilip Osker Ayoub, MD  08/05/2016 11:54 AM    [facility] [address]  [address] [place], [place]  [address] Pager [phone] Phone: [phone]; Fax: [phone]   [facility]  [address] [place], [place]  [address] [phone]   Fax [phone]

## 2016-08-05 NOTE — Patient Instructions (Signed)
Medication Instructions:  START Fenofibrate 160 mg once daily   Labwork: Your physician recommends that you return for lab work in: 3 months  You will need to FAST for this appointment - nothing to eat or drink after midnight the night before except water.    Testing/Procedures: None Ordered   Follow-Up: Your physician wants you to follow-up in: 1 year with Dr. Elease HashimotoNahser.  You will receive a reminder letter in the mail two months in advance. If you don't receive a letter, please call our office to schedule the follow-up appointment.   If you need a refill on your cardiac medications before your next appointment, please call your pharmacy.   Thank you for choosing [facility]! Eligha BridegroomMichelle Swinyer, RN [phone]

## 2016-09-23 DIAGNOSIS — Z794 Long term (current) use of insulin: Secondary | ICD-10-CM | POA: Diagnosis not present

## 2016-09-23 DIAGNOSIS — E1165 Type 2 diabetes mellitus with hyperglycemia: Secondary | ICD-10-CM | POA: Diagnosis not present

## 2016-11-04 ENCOUNTER — Other Ambulatory Visit: Payer: 59 | Admitting: *Deleted

## 2016-11-04 DIAGNOSIS — E7800 Pure hypercholesterolemia, unspecified: Secondary | ICD-10-CM | POA: Diagnosis not present

## 2016-11-04 DIAGNOSIS — I2510 Atherosclerotic heart disease of native coronary artery without angina pectoris: Secondary | ICD-10-CM | POA: Diagnosis not present

## 2016-11-05 LAB — COMPREHENSIVE METABOLIC PANEL
ALT: 20 IU/L (ref 0–44)
AST: 15 IU/L (ref 0–40)
Albumin/Globulin Ratio: 1.2 (ref 1.2–2.2)
Albumin: 3.7 g/dL (ref 3.5–5.5)
Alkaline Phosphatase: 67 IU/L (ref 39–117)
BUN/Creatinine Ratio: 18 (ref 9–20)
BUN: 17 mg/dL (ref 6–24)
Bilirubin Total: <0.2 mg/dL (ref 0.0–1.2)
CO2: 18 mmol/L (ref 18–29)
Calcium: 8.7 mg/dL (ref 8.7–10.2)
Chloride: 104 mmol/L (ref 96–106)
Creatinine, Ser: 0.92 mg/dL (ref 0.76–1.27)
GFR calc Af Amer: 109 mL/min/{1.73_m2} (ref >59)
GFR, EST NON AFRICAN AMERICAN: 94 mL/min/{1.73_m2} (ref >59)
Globulin, Total: 3.1 g/dL (ref 1.5–4.5)
Glucose: 231 mg/dL — ABNORMAL HIGH (ref 65–99)
Potassium: 4.3 mmol/L (ref 3.5–5.2)
Sodium: 140 mmol/L (ref 134–144)
TOTAL PROTEIN: 6.8 g/dL (ref 6.0–8.5)

## 2016-11-05 LAB — LIPID PANEL
CHOLESTEROL TOTAL: 141 mg/dL (ref 100–199)
Chol/HDL Ratio: 6.4 ratio units — ABNORMAL HIGH (ref 0.0–5.0)
HDL: 22 mg/dL — ABNORMAL LOW (ref >39)
LDL Calculated: 56 mg/dL (ref 0–99)
Triglycerides: 313 mg/dL — ABNORMAL HIGH (ref 0–149)
VLDL Cholesterol Cal: 63 mg/dL — ABNORMAL HIGH (ref 5–40)

## 2016-11-15 ENCOUNTER — Telehealth: Payer: Self-pay

## 2016-11-15 NOTE — Telephone Encounter (Signed)
Prior auth obtained for Lovaza (Omega 3) 1 gm through [employer]. PA # [id number] and is valid through 11/15/2017.

## 2016-11-25 DIAGNOSIS — Z794 Long term (current) use of insulin: Secondary | ICD-10-CM | POA: Diagnosis not present

## 2016-11-25 DIAGNOSIS — E1165 Type 2 diabetes mellitus with hyperglycemia: Secondary | ICD-10-CM | POA: Diagnosis not present

## 2016-11-25 DIAGNOSIS — R809 Proteinuria, unspecified: Secondary | ICD-10-CM | POA: Diagnosis not present

## 2017-04-28 DIAGNOSIS — Z794 Long term (current) use of insulin: Secondary | ICD-10-CM | POA: Diagnosis not present

## 2017-04-28 DIAGNOSIS — E1165 Type 2 diabetes mellitus with hyperglycemia: Secondary | ICD-10-CM | POA: Diagnosis not present

## 2017-08-23 DIAGNOSIS — J029 Acute pharyngitis, unspecified: Secondary | ICD-10-CM | POA: Diagnosis not present

## 2017-08-23 DIAGNOSIS — R6889 Other general symptoms and signs: Secondary | ICD-10-CM | POA: Diagnosis not present

## 2017-09-01 ENCOUNTER — Encounter: Payer: Self-pay | Admitting: Cardiovascular Disease

## 2017-09-01 ENCOUNTER — Ambulatory Visit (INDEPENDENT_AMBULATORY_CARE_PROVIDER_SITE_OTHER): Payer: 59 | Admitting: Cardiovascular Disease

## 2017-09-01 VITALS — BP 122/74 | HR 73 | Ht 67.0 in | Wt 208.0 lb

## 2017-09-01 DIAGNOSIS — I2510 Atherosclerotic heart disease of native coronary artery without angina pectoris: Secondary | ICD-10-CM

## 2017-09-01 DIAGNOSIS — E782 Mixed hyperlipidemia: Secondary | ICD-10-CM | POA: Diagnosis not present

## 2017-09-01 DIAGNOSIS — E1165 Type 2 diabetes mellitus with hyperglycemia: Secondary | ICD-10-CM | POA: Diagnosis not present

## 2017-09-01 DIAGNOSIS — Z794 Long term (current) use of insulin: Secondary | ICD-10-CM | POA: Diagnosis not present

## 2017-09-01 MED ORDER — ATORVASTATIN CALCIUM 80 MG PO TABS: 80.0000 mg | ORAL_TABLET | Freq: Every day | ORAL | 3 refills | Status: DC

## 2017-09-01 MED ORDER — FENOFIBRATE 160 MG PO TABS: 160.0000 mg | ORAL_TABLET | Freq: Every day | ORAL | 3 refills | Status: DC

## 2017-09-01 MED ORDER — METOPROLOL TARTRATE 25 MG PO TABS: 25.0000 mg | ORAL_TABLET | Freq: Two times a day (BID) | ORAL | 3 refills | Status: DC

## 2017-09-01 MED ORDER — OMEGA-3-ACID ETHYL ESTERS 1 G PO CAPS: 1.0000 | ORAL_CAPSULE | Freq: Every day | ORAL | 3 refills | Status: DC

## 2017-09-01 MED ORDER — NITROGLYCERIN 0.4 MG SL SUBL: 0.4000 mg | SUBLINGUAL_TABLET | SUBLINGUAL | 6 refills | Status: DC | PRN

## 2017-09-01 NOTE — Patient Instructions (Addendum)
Medication Instructions:  Your physician recommends that you continue on your current medications as directed. Please refer to the Current Medication list given to you today.   Labwork: TODAY - cholesterol, liver panel, basic metabolic panel   Testing/Procedures: None Ordered   Follow-Up: Your physician wants you to follow-up in: 1 year with Dr. Nahser.  You will receive a reminder letter in the mail two months in advance. If you don't receive a letter, please call our office to schedule the follow-up appointment.   If you need a refill on your cardiac medications before your next appointment, please call your pharmacy.   Thank you for choosing [facility]! Abdulla Pooley, RN [phone]    

## 2017-09-01 NOTE — Progress Notes (Signed)
Fernando Nelson Date of Birth  29-Dec-1960       [facility]    [place] [facility] [address]  [address], [address] [place], [place]  [address]   [place]  [id number] [phone]     [phone]   Fax  [phone]    Fax [phone]  Problem List: 1. Coronary Artery disease- s/p 3.5 EVOLVE II study stent by 20 mm to the RCA 2. Diabetes Mellitus 3. Hyperlipidemia / hypertriglyceridemia   History of Present Illness:  Fernando [name] is a 57 yo with a recent episode of CP. He was admitted with unstable angina. He was found to have a tight right coronary artery stenosis and had placement of a 3.5 mm drug-eluting stent to the RCA.  He has had twinges of chest pain that last only several seconds.  He is exercising 4 times a week.  Feb. 13, 2015:  Fernando [name] is doing very well. He is exercising without any chest pains or shortness breath. He is enrolled in the EVOLVE  II stent study.  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  Dr. Sharl MaKerr is following his lipid levels.  Nov. 22, 2016  Fernando [name] is seen today for follow up of his CAD Doing well.  No angina  Exercising  -  Has some right sided pain , constant , not similar to his presenting pain  Will talk to his primary medical doctor  Dec. 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    No angina.   Exercising , gym 3 days a week .     Has lost some weight.    Current Outpatient Medications on File Prior to Visit  Medication Sig Dispense Refill  . aspirin EC 81 MG tablet Take 81 mg by mouth every morning.    Marland Kitchen. atorvastatin (LIPITOR) 80 MG tablet Take 80 mg by mouth daily.    Marland Kitchen. HUMALOG KWIKPEN 200 UNIT/ML SOPN Inject 20 Units as directed 3 (three) times daily. WITH MEALS    . metoprolol tartrate (LOPRESSOR) 25 MG tablet Take 1 tablet (25 mg total) by mouth 2 (two)  times daily. 180 tablet 3  . Multiple Vitamin (MULTIVITAMIN WITH MINERALS) TABS Take 1 tablet by mouth daily.    . nitroGLYCERIN (NITROSTAT) 0.4 MG SL tablet Place 1 tablet (0.4 mg total) under the tongue every 5 (five) minutes x 3 doses as needed for chest pain. 25 tablet 6  . omega-3 acid ethyl esters (LOVAZA) 1 g capsule Take 1 capsule (1 g total) by mouth daily. 90 capsule 3  . ONETOUCH DELICA LANCETS 33G MISC as directed.    Fernando Nelson. ONETOUCH VERIO test strip as directed.    Marland Kitchen. SYNJARDY XR 12-998 MG TB24 Take 2 tablets by mouth daily.    . TRESIBA FLEXTOUCH 200 UNIT/ML SOPN Inject 60 Units as directed daily.    . valACYclovir (VALTREX) 500 MG tablet Take 500 mg by mouth 2 (two) times daily.    . fenofibrate 160 MG tablet Take 1 tablet (160 mg total) by mouth daily. (Patient not taking: Reported on 09/01/2017) 30 tablet 11   No current facility-administered medications on file prior to visit.     No Known Allergies  Past Medical History:  Diagnosis Date  . CAD (coronary artery disease)  a. [place] 02/2012 - s/p PTCA/Evolve II study stent to RCA 03/23/12, residual LAD dz  . Hepatitis B ~ 2000   "donated blood to [employer]; got letter; had shots over 6 months; gone now"  . Hyperlipemia   . Type II diabetes mellitus (HCC)     Past Surgical History:  Procedure Laterality Date  . CORONARY ANGIOPLASTY WITH STENT PLACEMENT  03/23/12   "1; first one ever"  . LEFT HEART CATHETERIZATION WITH CORONARY ANGIOGRAM N/A 03/23/2012   Procedure: LEFT HEART CATHETERIZATION WITH CORONARY ANGIOGRAM;  Surgeon: Dolores Patty, MD;  Location: [facility] C[facility] LAB;  Service: Cardiovascular;  Laterality: N/A;  . PERCUTANEOUS CORONARY STENT INTERVENTION (PCI-S)  03/23/2012   Procedure: PERCUTANEOUS CORONARY STENT INTERVENTION (PCI-S);  Surgeon: Dolores Patty, MD;  Location: [facility] CATH LAB;  Service: Cardiovascular;;  . SHOULDER OPEN ROTATOR CUFF REPAIR  2005   left    Social History   Tobacco Use  Smoking Status Former  Smoker  . Packs/day: 1.00  . Years: 25.00  . Pack years: 25.00  . Types: Cigarettes  . Last attempt to quit: 09/22/2009  . Years since quitting: 7.9  Smokeless Tobacco Never Used    Social History   Substance and Sexual Activity  Alcohol Use No    Family History  Problem Relation Age of Onset  . Heart failure Mother     Reviw of Systems:  Reviewed in the HPI.  All other systems are negative.  Physical Exam: Blood pressure 122/74, pulse 73, height 5\' 7"  (1.702 m), weight 208 lb (94.3 kg).  GEN:  Well nourished, well developed in no acute distress HEENT: Normal NECK: No JVD; No carotid bruits LYMPHATICS: No lymphadenopathy CARDIAC: RR, no murmurs, rubs, gallops RESPIRATORY:  Clear to auscultation without rales, wheezing or rhonchi  ABDOMEN: Soft, non-tender, non-distended MUSCULOSKELETAL:  No edema; No deformity  SKIN: Warm and dry NEUROLOGIC:  Alert and oriented x 3   ECG: September 01, 2017:  Normal sinus rhythm at 73 beats a minute.  He has no ST or T wave changes.  EKG is normal.  Assessment / Plan:   1. Coronary Artery disease- s/p 3.5 EVOLVE II study stent by 20 mm to the RCA-  He has not had any episodes of chest pain.  Continue current medications.  2. Diabetes Mellitus  3. Hyperlipidemia / hypertriglyceridemia -   Continue medications.  Will check labs today.    Kristeen Miss, MD  09/01/2017 3:20 PM    [facility] [address] [place],  [address] [place], [place]  [address] Pager [phone] Phone: [phone]; Fax: [phone]

## 2017-09-02 LAB — BASIC METABOLIC PANEL
BUN/Creatinine Ratio: 13 (ref 9–20)
BUN: 11 mg/dL (ref 6–24)
CO2: 18 mmol/L — AB (ref 20–29)
CREATININE: 0.85 mg/dL (ref 0.76–1.27)
Calcium: 9.1 mg/dL (ref 8.7–10.2)
Chloride: 101 mmol/L (ref 96–106)
GFR calc Af Amer: 113 mL/min/{1.73_m2} (ref >59)
GFR calc non Af Amer: 98 mL/min/{1.73_m2} (ref >59)
GLUCOSE: 96 mg/dL (ref 65–99)
Potassium: 4.4 mmol/L (ref 3.5–5.2)
Sodium: 136 mmol/L (ref 134–144)

## 2017-09-02 LAB — HEPATIC FUNCTION PANEL
ALK PHOS: 97 IU/L (ref 39–117)
ALT: 16 IU/L (ref 0–44)
AST: 12 IU/L (ref 0–40)
Albumin: 4 g/dL (ref 3.5–5.5)
BILIRUBIN, DIRECT: 0.06 mg/dL (ref 0.00–0.40)
Bilirubin Total: <0.2 mg/dL (ref 0.0–1.2)
TOTAL PROTEIN: 7.7 g/dL (ref 6.0–8.5)

## 2017-09-02 LAB — LIPID PANEL
Chol/HDL Ratio: 6.6 ratio — ABNORMAL HIGH (ref 0.0–5.0)
Cholesterol, Total: 179 mg/dL (ref 100–199)
HDL: 27 mg/dL — AB (ref >39)
LDL Calculated: 89 mg/dL (ref 0–99)
Triglycerides: 316 mg/dL — ABNORMAL HIGH (ref 0–149)
VLDL Cholesterol Cal: 63 mg/dL — ABNORMAL HIGH (ref 5–40)

## 2017-10-29 ENCOUNTER — Other Ambulatory Visit: Payer: Self-pay

## 2017-10-29 ENCOUNTER — Encounter (HOSPITAL_BASED_OUTPATIENT_CLINIC_OR_DEPARTMENT_OTHER): Payer: Self-pay | Admitting: Emergency Medicine

## 2017-10-29 ENCOUNTER — Emergency Department (HOSPITAL_BASED_OUTPATIENT_CLINIC_OR_DEPARTMENT_OTHER)
Admission: EM | Admit: 2017-10-29 | Discharge: 2017-10-29 | Disposition: A | Discharge status: Home / Self Care | Payer: 59 | Attending: Emergency Medicine | Admitting: Emergency Medicine

## 2017-10-29 ENCOUNTER — Emergency Department (HOSPITAL_BASED_OUTPATIENT_CLINIC_OR_DEPARTMENT_OTHER): Payer: 59

## 2017-10-29 DIAGNOSIS — R05 Cough: Secondary | ICD-10-CM | POA: Insufficient documentation

## 2017-10-29 DIAGNOSIS — Z5321 Procedure and treatment not carried out due to patient leaving prior to being seen by health care provider: Secondary | ICD-10-CM | POA: Insufficient documentation

## 2017-10-29 NOTE — ED Triage Notes (Signed)
Cough, body aches, fatigue since Friday.

## 2017-10-29 NOTE — ED Notes (Signed)
Pt called x3 for room, no answer.  

## 2017-11-03 DIAGNOSIS — R05 Cough: Secondary | ICD-10-CM | POA: Diagnosis not present

## 2017-11-03 DIAGNOSIS — H9313 Tinnitus, bilateral: Secondary | ICD-10-CM | POA: Diagnosis not present

## 2017-12-15 DIAGNOSIS — E119 Type 2 diabetes mellitus without complications: Secondary | ICD-10-CM | POA: Diagnosis not present

## 2018-01-05 ENCOUNTER — Other Ambulatory Visit: Payer: Self-pay | Admitting: Internal Medicine

## 2018-01-05 DIAGNOSIS — Z794 Long term (current) use of insulin: Secondary | ICD-10-CM | POA: Diagnosis not present

## 2018-01-05 DIAGNOSIS — R109 Unspecified abdominal pain: Secondary | ICD-10-CM | POA: Diagnosis not present

## 2018-01-05 DIAGNOSIS — R140 Abdominal distension (gaseous): Secondary | ICD-10-CM

## 2018-01-05 DIAGNOSIS — R1110 Vomiting, unspecified: Secondary | ICD-10-CM

## 2018-01-05 DIAGNOSIS — Z79899 Other long term (current) drug therapy: Secondary | ICD-10-CM | POA: Diagnosis not present

## 2018-01-05 DIAGNOSIS — E1165 Type 2 diabetes mellitus with hyperglycemia: Secondary | ICD-10-CM | POA: Diagnosis not present

## 2018-01-17 ENCOUNTER — Ambulatory Visit
Admission: RE | Admit: 2018-01-17 | Discharge: 2018-01-17 | Disposition: A | Discharge status: Home / Self Care | Payer: 59 | Source: Ambulatory Visit | Attending: Internal Medicine | Admitting: Internal Medicine

## 2018-01-17 DIAGNOSIS — R140 Abdominal distension (gaseous): Secondary | ICD-10-CM

## 2018-01-17 DIAGNOSIS — K760 Fatty (change of) liver, not elsewhere classified: Secondary | ICD-10-CM | POA: Diagnosis not present

## 2018-01-17 DIAGNOSIS — R1110 Vomiting, unspecified: Secondary | ICD-10-CM

## 2018-01-17 DIAGNOSIS — R109 Unspecified abdominal pain: Secondary | ICD-10-CM

## 2018-01-26 DIAGNOSIS — D72820 Lymphocytosis (symptomatic): Secondary | ICD-10-CM | POA: Diagnosis not present

## 2018-04-13 DIAGNOSIS — Z794 Long term (current) use of insulin: Secondary | ICD-10-CM | POA: Diagnosis not present

## 2018-04-13 DIAGNOSIS — B9789 Other viral agents as the cause of diseases classified elsewhere: Secondary | ICD-10-CM | POA: Diagnosis not present

## 2018-04-13 DIAGNOSIS — E1165 Type 2 diabetes mellitus with hyperglycemia: Secondary | ICD-10-CM | POA: Diagnosis not present

## 2018-04-13 DIAGNOSIS — J069 Acute upper respiratory infection, unspecified: Secondary | ICD-10-CM | POA: Diagnosis not present

## 2018-04-13 DIAGNOSIS — E669 Obesity, unspecified: Secondary | ICD-10-CM | POA: Diagnosis not present

## 2018-04-16 DIAGNOSIS — E1165 Type 2 diabetes mellitus with hyperglycemia: Secondary | ICD-10-CM | POA: Diagnosis not present

## 2018-04-16 DIAGNOSIS — H9313 Tinnitus, bilateral: Secondary | ICD-10-CM | POA: Diagnosis not present

## 2018-04-16 DIAGNOSIS — J209 Acute bronchitis, unspecified: Secondary | ICD-10-CM | POA: Diagnosis not present

## 2018-04-16 DIAGNOSIS — Z23 Encounter for immunization: Secondary | ICD-10-CM | POA: Diagnosis not present

## 2018-04-16 DIAGNOSIS — Z125 Encounter for screening for malignant neoplasm of prostate: Secondary | ICD-10-CM | POA: Diagnosis not present

## 2018-04-16 DIAGNOSIS — Z0000 Encounter for general adult medical examination without abnormal findings: Secondary | ICD-10-CM | POA: Diagnosis not present

## 2018-05-11 DIAGNOSIS — Z77122 Contact with and (suspected) exposure to noise: Secondary | ICD-10-CM | POA: Diagnosis not present

## 2018-05-11 DIAGNOSIS — H903 Sensorineural hearing loss, bilateral: Secondary | ICD-10-CM | POA: Diagnosis not present

## 2018-05-11 DIAGNOSIS — H9313 Tinnitus, bilateral: Secondary | ICD-10-CM | POA: Diagnosis not present

## 2018-05-23 ENCOUNTER — Other Ambulatory Visit: Payer: Self-pay | Admitting: Otolaryngology

## 2018-05-23 DIAGNOSIS — H903 Sensorineural hearing loss, bilateral: Secondary | ICD-10-CM

## 2018-05-23 DIAGNOSIS — IMO0001 Reserved for inherently not codable concepts without codable children: Secondary | ICD-10-CM

## 2018-06-08 ENCOUNTER — Ambulatory Visit
Admission: RE | Admit: 2018-06-08 | Discharge: 2018-06-08 | Disposition: A | Discharge status: Home / Self Care | Payer: 59 | Source: Ambulatory Visit | Attending: Otolaryngology | Admitting: Otolaryngology

## 2018-06-08 DIAGNOSIS — H903 Sensorineural hearing loss, bilateral: Secondary | ICD-10-CM | POA: Diagnosis not present

## 2018-06-08 DIAGNOSIS — IMO0001 Reserved for inherently not codable concepts without codable children: Secondary | ICD-10-CM

## 2018-06-08 MED ORDER — GADOBENATE DIMEGLUMINE 529 MG/ML IV SOLN
20.0000 mL | Freq: Once | INTRAVENOUS | Status: AC | PRN
  Administered 2018-06-08: 20 mL via INTRAVENOUS

## 2018-06-21 DIAGNOSIS — H903 Sensorineural hearing loss, bilateral: Secondary | ICD-10-CM | POA: Diagnosis not present

## 2018-08-02 DIAGNOSIS — R05 Cough: Secondary | ICD-10-CM | POA: Diagnosis not present

## 2018-08-06 DIAGNOSIS — H903 Sensorineural hearing loss, bilateral: Secondary | ICD-10-CM | POA: Diagnosis not present

## 2018-10-06 ENCOUNTER — Other Ambulatory Visit: Payer: Self-pay | Admitting: Cardiovascular Disease

## 2018-12-13 ENCOUNTER — Telehealth: Payer: Self-pay | Admitting: Cardiovascular Disease

## 2018-12-13 NOTE — Telephone Encounter (Signed)
Spoke with patient who confirmed all demographics. Patient uses My Chart and has a smart phone.  Will have vitals ready for visit. °

## 2018-12-31 ENCOUNTER — Telehealth (INDEPENDENT_AMBULATORY_CARE_PROVIDER_SITE_OTHER): Payer: 59 | Admitting: Cardiovascular Disease

## 2018-12-31 ENCOUNTER — Encounter: Payer: Self-pay | Admitting: Cardiovascular Disease

## 2018-12-31 ENCOUNTER — Other Ambulatory Visit: Payer: Self-pay

## 2018-12-31 VITALS — BP 121/73 | HR 82 | Ht 70.0 in | Wt 202.0 lb

## 2018-12-31 DIAGNOSIS — I2510 Atherosclerotic heart disease of native coronary artery without angina pectoris: Secondary | ICD-10-CM

## 2018-12-31 DIAGNOSIS — E782 Mixed hyperlipidemia: Secondary | ICD-10-CM | POA: Diagnosis not present

## 2018-12-31 DIAGNOSIS — Z7189 Other specified counseling: Secondary | ICD-10-CM

## 2018-12-31 MED ORDER — METOPROLOL TARTRATE 25 MG PO TABS
25.0000 mg | ORAL_TABLET | Freq: Two times a day (BID) | ORAL | 3 refills | Status: DC
Start: 2018-12-31 — End: 2019-12-27

## 2018-12-31 MED ORDER — NITROGLYCERIN 0.4 MG SL SUBL: 0.4000 mg | SUBLINGUAL_TABLET | SUBLINGUAL | 6 refills | Status: DC | PRN

## 2018-12-31 MED ORDER — ATORVASTATIN CALCIUM 80 MG PO TABS: 80.0000 mg | ORAL_TABLET | Freq: Every day | ORAL | 3 refills | Status: DC

## 2018-12-31 MED ORDER — OMEGA-3-ACID ETHYL ESTERS 1 G PO CAPS: 1.0000 | ORAL_CAPSULE | Freq: Every day | ORAL | 3 refills | Status: DC

## 2018-12-31 NOTE — Progress Notes (Signed)
Virtual Visit via Video Note   This visit type was conducted due to national recommendations for restrictions regarding the COVID-19 Pandemic (e.g. social distancing) in an effort to limit this patient's exposure and mitigate transmission in our community.  Due to his co-morbid illnesses, this patient is at least at moderate risk for complications without adequate follow up.  This format is felt to be most appropriate for this patient at this time.  All issues noted in this document were discussed and addressed.  A limited physical exam was performed with this format.  Please refer to the patient's chart for his consent to telehealth for [facility].   Date:  12/31/2018   ID:  Fernando SaverWillem E Nelson, DOB August 18, 1961, MRN [id number]  Patient Location: Home Provider Location: Home  PCP:  Johny BlamerHarris, William, MD  Cardiologist:  Kristeen MissPhilip , MD  Electrophysiologist:  None   Evaluation Performed:  Follow-Up Visit  Chief Complaint:    History of Present Illness:    Fernando Nelson is a 58 y.o. male with  CAD , DM, hyperlipidemia    Problem List: 1. Coronary Artery disease- s/p 3.5 EVOLVE II study stent by 20 mm to the RCA 2. Diabetes Mellitus 3. Hyperlipidemia / hypertriglyceridemia   Fernando Nelson is a [age] yo with a recent episode of CP. He was admitted with unstable angina. He was found to have a tight right coronary artery stenosis and had placement of a 3.5 mm drug-eluting stent to the RCA.  He has had twinges of chest pain that last only several seconds.  He is exercising 4 times a week.  Feb. 13, 2015:  Fernando Nelson is doing very well. He is exercising without any chest pains or shortness breath. He is enrolled in the EVOLVE  II stent study.  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  Dr. Sharl MaKerr is following his lipid levels.  Nov. 22, 2016  Fernando Nelson is seen today for follow up of his CAD Doing well.  No angina  Exercising  -  Has some right sided pain , constant , not similar to his  presenting pain  Will talk to his primary medical doctor  Dec. 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    No angina.   Exercising , gym 3 days a week .     Has lost some weight.   Dec 31, 2018  Doing well.     No CP, no dyspnea.  Some GERD .  Exercising regularly  Has lost 22 lbs over this year  Working in his yard lots  The patient does not have symptoms concerning for COVID-19 infection (fever, chills, cough, or new shortness of breath).    Past Medical History:  Diagnosis Date  . CAD (coronary artery disease)    a. [place] 02/2012 - s/p PTCA/Evolve II study stent to RCA 03/23/12, residual LAD dz  . Hepatitis B ~ 2000   "donated blood to [employer]; got letter; had shots over 6 months; gone now"  . Hyperlipemia   . Type II diabetes mellitus (HCC)    Past Surgical History:  Procedure Laterality Date  . CORONARY ANGIOPLASTY WITH STENT PLACEMENT  03/23/12   "1; first one ever"  . LEFT HEART CATHETERIZATION WITH CORONARY ANGIOGRAM N/A 03/23/2012   Procedure: LEFT HEART CATHETERIZATION WITH CORONARY  Rosalin Hawking;  Surgeon: Dolores Patty, MD;  Location: [facility] CATH LAB;  Service: Cardiovascular;  Laterality: N/A;  . PERCUTANEOUS CORONARY STENT INTERVENTION (PCI-S)  03/23/2012   Procedure: PERCUTANEOUS CORONARY STENT INTERVENTION (PCI-S);  Surgeon: Dolores Patty, MD;  Location: [facility] CATH LAB;  Service: Cardiovascular;;  . SHOULDER OPEN ROTATOR CUFF REPAIR  2005   left     Current Meds  Medication Sig  . aspirin EC 81 MG tablet Take 81 mg by mouth every morning.  Marland Kitchen atorvastatin (LIPITOR) 80 MG tablet Take 1 tablet (80 mg total) by mouth daily.  Marland Kitchen HUMALOG KWIKPEN 200 UNIT/ML SOPN Inject 20 Units as directed 3 (three) times daily. WITH MEALS  . metoprolol tartrate (LOPRESSOR) 25 MG tablet Take 1 tablet (25 mg total) by mouth 2 (two) times  daily.  . Multiple Vitamin (MULTIVITAMIN WITH MINERALS) TABS Take 1 tablet by mouth daily.  Marland Kitchen omega-3 acid ethyl esters (LOVAZA) 1 g capsule Take 1 capsule (1 g total) by mouth daily. Please make overdue appt with Dr. Elease Hashimoto before anymore refills. 1st attempt  . TRESIBA FLEXTOUCH 200 UNIT/ML SOPN Inject 60 Units as directed daily.  . valACYclovir (VALTREX) 500 MG tablet Take 500 mg by mouth 2 (two) times daily.     Allergies:   Patient has no known allergies.   Social History   Tobacco Use  . Smoking status: Former Smoker    Packs/day: 1.00    Years: 25.00    Pack years: 25.00    Types: Cigarettes    Last attempt to quit: 09/22/2009    Years since quitting: 9.2  . Smokeless tobacco: Never Used  Substance Use Topics  . Alcohol use: No  . Drug use: No    Types: Cocaine, Marijuana, Heroin, Hashish    Comment: 03/23/12 "last drug use was in my teens"     Family Hx: The patient's family history includes Heart failure in his mother.  ROS:   Please see the history of present illness.     All other systems reviewed and are negative.   Prior CV studies:   The following studies were reviewed today:    Labs/Other Tests and Data Reviewed:    EKG:  No ECG reviewed.  Recent Labs: No results found for requested labs within last 8760 hours.   Recent Lipid Panel Lab Results  Component Value Date/Time   CHOL 179 09/01/2017 03:34 PM   TRIG 316 (H) 09/01/2017 03:34 PM   HDL 27 (L) 09/01/2017 03:34 PM   CHOLHDL 6.6 (H) 09/01/2017 03:34 PM   CHOLHDL 6 10/04/2013 09:25 AM   LDLCALC 89 09/01/2017 03:34 PM   LDLDIRECT 85.2 10/04/2013 09:25 AM    Wt Readings from Last 3 Encounters:  12/31/18 202 lb (91.6 kg)  10/29/17 199 lb (90.3 kg)  09/01/17 208 lb (94.3 kg)     Objective:    Vital Signs:  BP 121/73   Pulse 82   Ht 5\' 10"  (1.778 m)   Wt 202 lb (91.6 kg)   BMI 28.98 kg/m    VITAL SIGNS:  reviewed GEN:  no acute distress EYES:  sclerae anicteric, EOMI - Extraocular  Movements Intact RESPIRATORY:  normal respiratory effort, symmetric expansion CARDIOVASCULAR:  no peripheral edema SKIN:  no rash, lesions or ulcers. MUSCULOSKELETAL:  no obvious deformities. NEURO:  alert and oriented x 3, no obvious focal deficit PSYCH:  normal affect  ASSESSMENT & PLAN:    1. CAD :  No angina.  Refilled NTG.  Continue meds Is exercising regularly without any angina    2.  Hyperlipidemia :  Last labs 1 year ago look ok Will have his endocrinologist recheck and send to [place]   3. Diabetes Mellitus : Managed by Dr. [name].   COVID-19 Education: The signs and symptoms of COVID-19 were discussed with the patient and how to seek care for testing (follow up with PCP or arrange E-visit).  The importance of social distancing was discussed today.  Time:   Today, I have spent  15  minutes with the patient with telehealth technology discussing the above problems.     Medication Adjustments/Labs and Tests Ordered: Current medicines are reviewed at length with the patient today.  Concerns regarding medicines are outlined above.   Tests Ordered: No orders of the defined types were placed in this encounter.   Medication Changes: No orders of the defined types were placed in this encounter.   Disposition:  Follow up in 1 year(s)  Signed, Kristeen Miss, MD  12/31/2018 9:58 AM    [facility]

## 2018-12-31 NOTE — Patient Instructions (Signed)

## 2018-12-31 NOTE — Telephone Encounter (Signed)
° ° °  Patient requesting additional assistance with appointment set up

## 2019-03-29 IMAGING — MR MR BRAIN/IAC WO/W
14 of 15 series · 40 of 48 positions shown · IV contrast (multihance)
Comparison: None.

CLINICAL DATA: Asymmetric sensorineural hearing loss both ears

Creatinine was obtained on site at [HOSPITAL] at [HOSPITAL].
Results: Creatinine 0.8 mg/dL.
EXAM:
MRI HEAD WITHOUT AND WITH CONTRAST
TECHNIQUE: Multiplanar, multiecho pulse sequences of the brain and surrounding
structures were obtained without and with intravenous contrast.
CONTRAST:  20mL MULTIHANCE GADOBENATE DIMEGLUMINE 529 MG/ML IV SOLN

[Series 2: T1 · sagittal · 5.0mm · 0.45mm/px · 1 of 21 slices shown (1 of 4)]
[im 1/21]
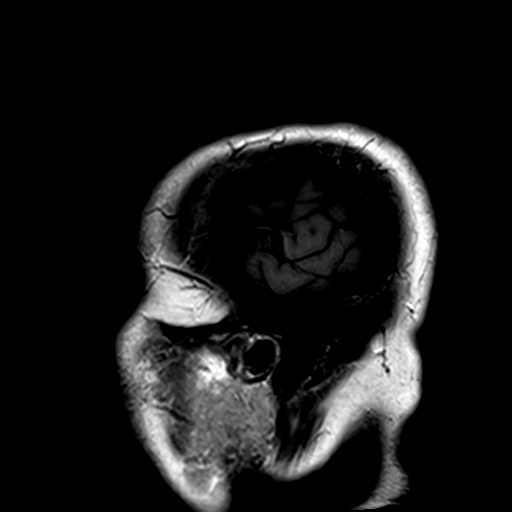

[Series 3: ep2d_diff_(id)_trace · axial · 3.0mm · 1.80mm/px · z∈[-95,+50]mm · 6 of 97 slices shown]
[im 1/97]
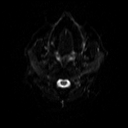
[im 20/97]
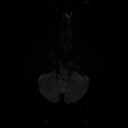
[im 39/97]
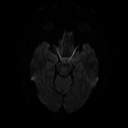
[im 58/97]
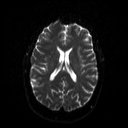
[im 77/97]
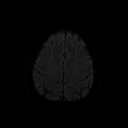
[im 97/97]
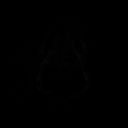

[Series 4: ep2d_diff_(id)_trace_adc · axial · 3.0mm · 1.80mm/px · z∈[-95,+50]mm · 4 of 50 slices shown]
[im 1/50]
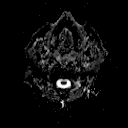
[im 17/50]
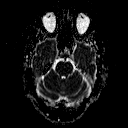
[im 33/50]
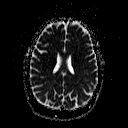
[im 50/50]
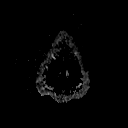

[Series 5: t2_tse_tra_512 · axial · 5.0mm · 0.60mm/px · z∈[-92,+50]mm · 2 of 24 slices shown]
[im 1/24]
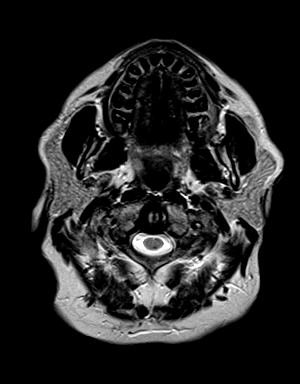
[im 24/24]
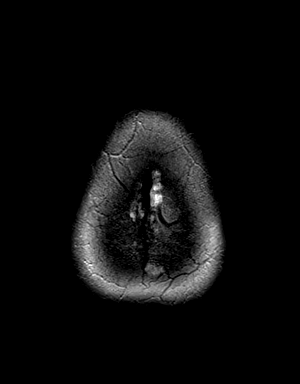

[Series 7: swi_images · axial · 2.0mm · 0.90mm/px · z∈[-100,+57]mm · 6 of 80 slices shown]
[im 1/80]
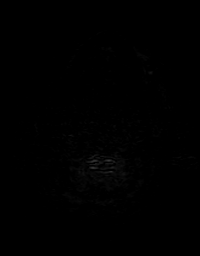
[im 16/80]
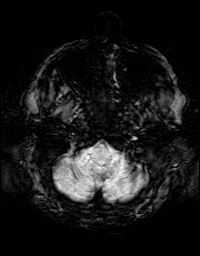
[im 32/80]
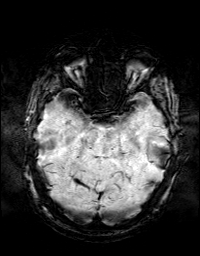
[im 48/80]
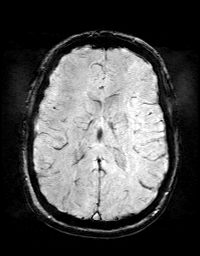
[im 64/80]
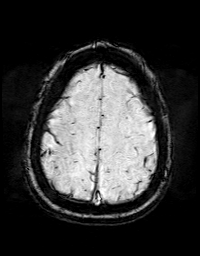
[im 80/80]
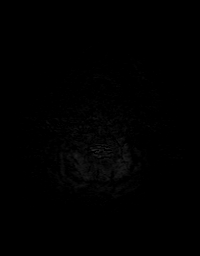

[Series 8: FLAIR · axial · 3.0mm · 0.43mm/px · z∈[-100,+55]mm · 2 of 27 slices shown]
[im 1/27]
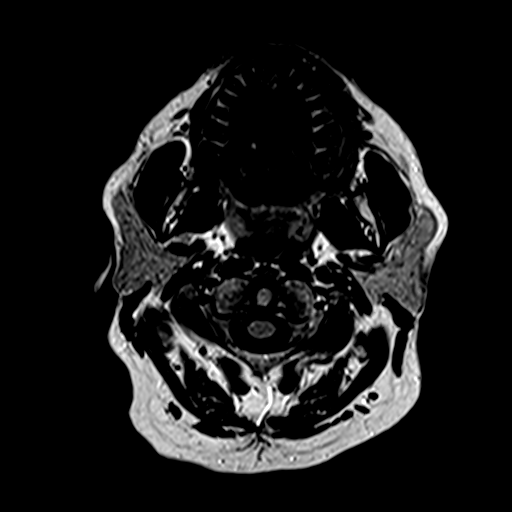
[im 27/27]
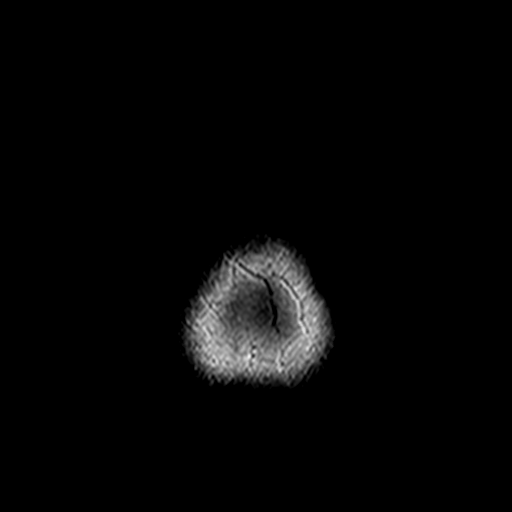

[Series 9: t1_mpr_tra · axial · 1.0mm · 0.72mm/px · z∈[-92,+50]mm · 8 of 144 slices shown]
[im 1/144]
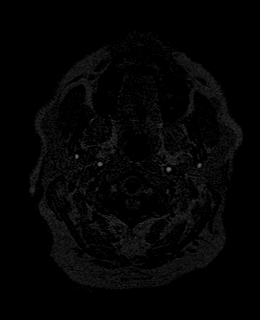
[im 16/144]
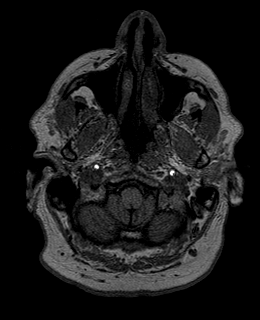
[im 48/144]
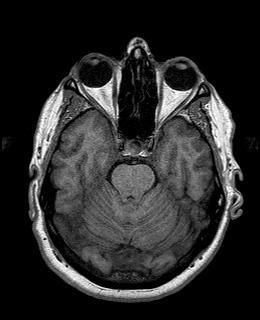
[im 64/144]
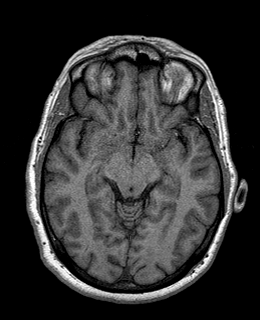
[im 80/144]
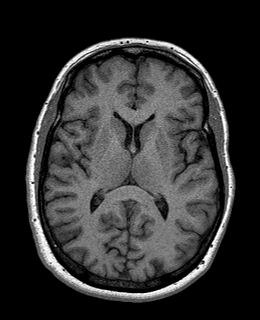
[im 96/144]
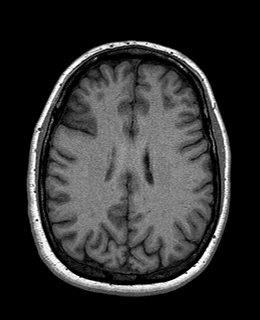
[im 128/144]
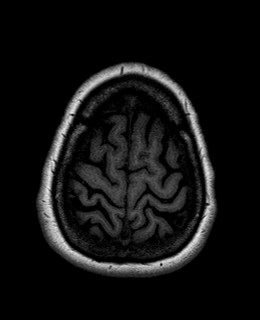
[im 144/144]
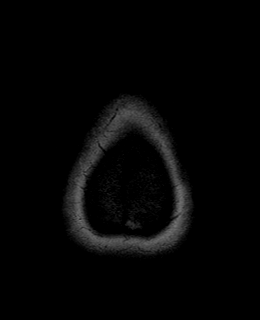

[Series 10: T1 · coronal · 3.0mm · 0.35mm/px · 1 of 11 slices shown (2 of 4)]
[im 1/11]
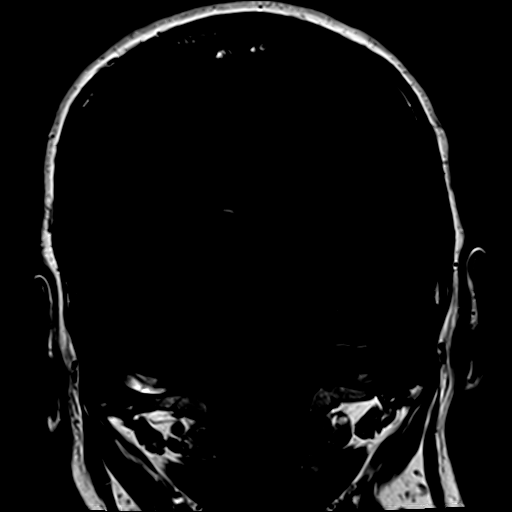

[Series 11: T1 · axial · 3.0mm · 0.35mm/px · 1 of 11 slices shown (3 of 4)]
[im 1/11]
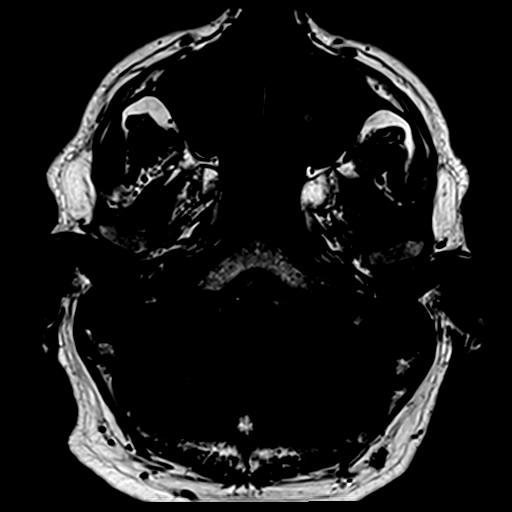

[Series 12: bSSFP · axial · 0.7mm · 0.28mm/px · z∈[-75,-45]mm · 3 of 44 slices shown]
[im 1/44]
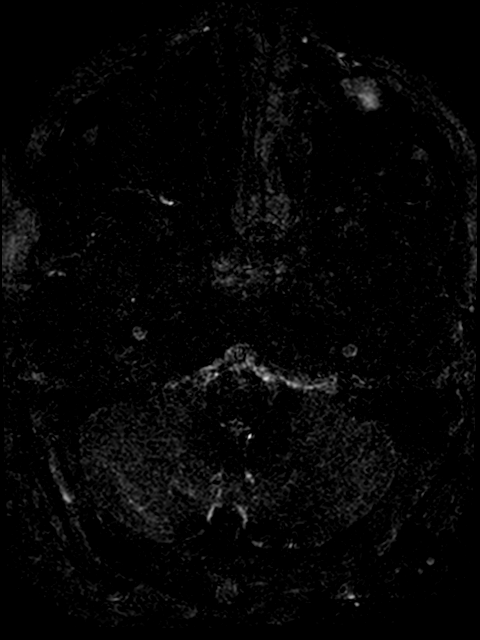
[im 22/44]
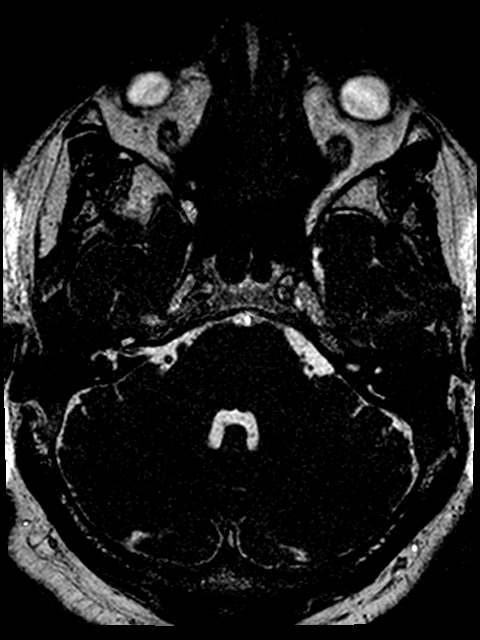
[im 44/44]
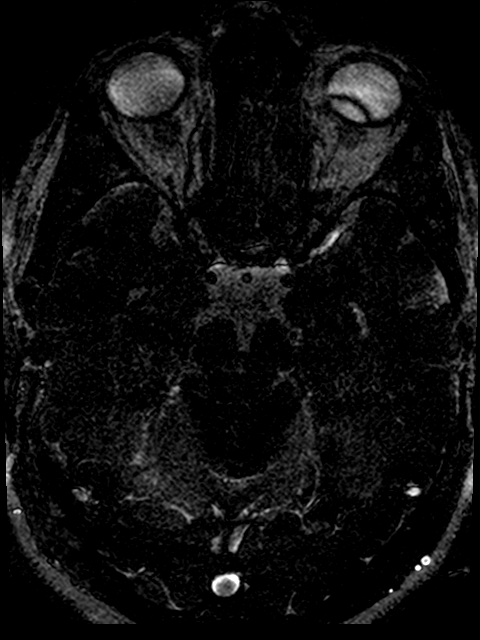

[Series 13: T2 post-contrast · coronal · 5.0mm · 0.45mm/px · 2 of 30 slices shown]
[im 1/30]
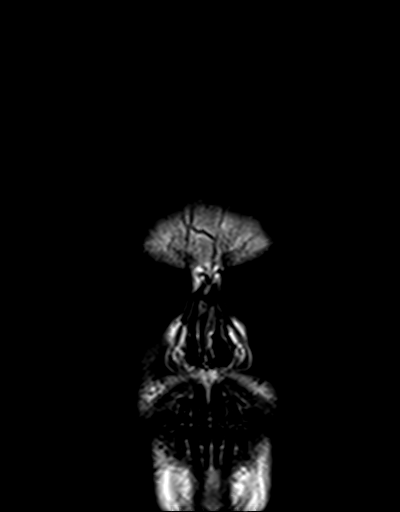
[im 30/30]
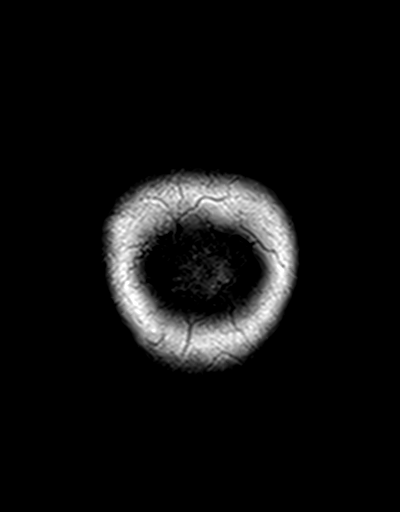

[Series 14: T1 · coronal · 3.0mm · 0.35mm/px · 1 of 11 slices shown (4 of 4)]
[im 1/11]
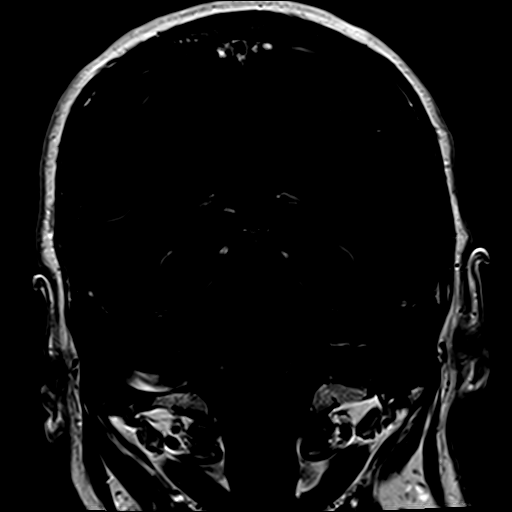

[Series 15: T1 post-contrast · axial · 3.0mm · 0.35mm/px · 1 of 11 slices shown (1 of 2)]
[im 1/11]
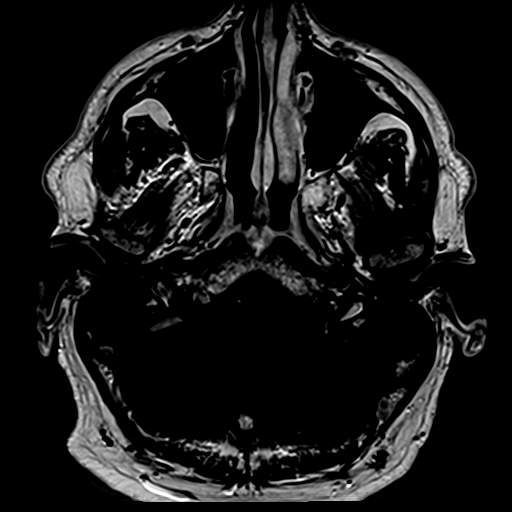

[Series 17: T1 post-contrast · coronal · 5.0mm · 0.72mm/px · 2 of 30 slices shown (2 of 2)]
[im 1/30]
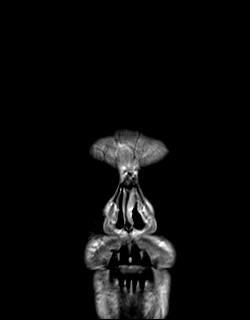
[im 30/30]
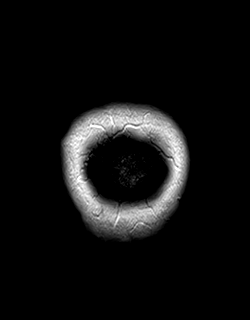

[40 of 48 positions shown; findings below may reference images not displayed]

FINDINGS: Brain: IAC protocol was performed including thin section imaging
through the posterior fossa before and after intravenous contrast.
Seventh and 8th cranial nerves are normal. Negative for vestibular
schwannoma. Basilar cisterns normal. Brainstem and cerebellum
normal. Mastoid sinus is clear bilaterally. No enhancing mass in the
posterior fossa or temporal bone.

Ventricle size normal. Negative for infarct, hemorrhage, or mass
lesion. Normal enhancement of the brain.

Vascular: Normal arterial flow voids

Skull and upper cervical spine: Negative

Sinuses/Orbits: Mild mucosal edema left maxillary sinus. Remaining
sinuses clear. Normal orbit.

Other: None
IMPRESSION: Negative MRI brain with contrast. No cause for hearing loss
identified with special attention to the internal auditory canal
region.

## 2019-07-12 ENCOUNTER — Other Ambulatory Visit: Payer: Self-pay | Admitting: Cardiovascular Disease

## 2019-10-04 ENCOUNTER — Telehealth: Payer: Self-pay

## 2019-10-04 NOTE — Telephone Encounter (Signed)
**Note De-Identified Fernando Nelson Obfuscation** I started an Omega-3-Acid PA through covermymeds. Key: [id number]

## 2019-10-04 NOTE — Telephone Encounter (Signed)
**Note De-Identified Shakaya Bhullar Obfuscation** Letter received from [employer] stating that they have approved the pts Omega 3- Acid PA. Approval is good until 10/03/2020 Case#: [id number]  I have notified [facility] of this approval.

## 2019-12-09 ENCOUNTER — Other Ambulatory Visit: Payer: Self-pay | Admitting: Nurse Practitioner

## 2019-12-09 MED ORDER — ATORVASTATIN CALCIUM 80 MG PO TABS: 80.0000 mg | ORAL_TABLET | Freq: Every day | ORAL | 0 refills | Status: DC

## 2019-12-27 ENCOUNTER — Other Ambulatory Visit: Payer: Self-pay

## 2019-12-27 ENCOUNTER — Encounter: Payer: Self-pay | Admitting: Cardiovascular Disease

## 2019-12-27 ENCOUNTER — Ambulatory Visit: Payer: 59 | Admitting: Cardiovascular Disease

## 2019-12-27 VITALS — BP 114/70 | HR 78 | Ht 66.5 in | Wt 230.0 lb

## 2019-12-27 DIAGNOSIS — E782 Mixed hyperlipidemia: Secondary | ICD-10-CM | POA: Diagnosis not present

## 2019-12-27 DIAGNOSIS — I2510 Atherosclerotic heart disease of native coronary artery without angina pectoris: Secondary | ICD-10-CM

## 2019-12-27 LAB — LIPID PANEL
Chol/HDL Ratio: 4.9 ratio (ref 0.0–5.0)
Cholesterol, Total: 152 mg/dL (ref 100–199)
HDL: 31 mg/dL — ABNORMAL LOW (ref >39)
LDL Chol Calc (NIH): 63 mg/dL (ref 0–99)
Triglycerides: 372 mg/dL — ABNORMAL HIGH (ref 0–149)
VLDL Cholesterol Cal: 58 mg/dL — ABNORMAL HIGH (ref 5–40)

## 2019-12-27 LAB — BASIC METABOLIC PANEL
BUN/Creatinine Ratio: 18 (ref 9–20)
BUN: 15 mg/dL (ref 6–24)
CO2: 24 mmol/L (ref 20–29)
Calcium: 9.1 mg/dL (ref 8.7–10.2)
Chloride: 101 mmol/L (ref 96–106)
Creatinine, Ser: 0.83 mg/dL (ref 0.76–1.27)
GFR calc Af Amer: 113 mL/min/{1.73_m2} (ref >59)
GFR calc non Af Amer: 98 mL/min/{1.73_m2} (ref >59)
Glucose: 90 mg/dL (ref 65–99)
Potassium: 4.1 mmol/L (ref 3.5–5.2)
Sodium: 139 mmol/L (ref 134–144)

## 2019-12-27 LAB — HEPATIC FUNCTION PANEL
ALT: 21 IU/L (ref 0–44)
AST: 13 IU/L (ref 0–40)
Albumin: 3.7 g/dL — ABNORMAL LOW (ref 3.8–4.9)
Alkaline Phosphatase: 119 IU/L — ABNORMAL HIGH (ref 39–117)
Bilirubin Total: 0.3 mg/dL (ref 0.0–1.2)
Bilirubin, Direct: 0.09 mg/dL (ref 0.00–0.40)
Total Protein: 7.2 g/dL (ref 6.0–8.5)

## 2019-12-27 MED ORDER — METOPROLOL TARTRATE 25 MG PO TABS: 25.0000 mg | ORAL_TABLET | Freq: Two times a day (BID) | ORAL | 3 refills | Status: DC

## 2019-12-27 MED ORDER — NITROGLYCERIN 0.4 MG SL SUBL: 0.4000 mg | SUBLINGUAL_TABLET | SUBLINGUAL | 6 refills | Status: AC | PRN

## 2019-12-27 NOTE — Progress Notes (Signed)
Fernando Nelson Date of Birth  1961-03-31       [facility] [address], [address], [place], [place]  [address]   [place], [place]  [address] [phone]     [phone]   Fax  [phone]    Fax [phone]  Problem List: 1. Coronary Artery disease- s/p 3.5 EVOLVE II study stent by 20 mm to the RCA 2. Diabetes Mellitus 3. Hyperlipidemia / hypertriglyceridemia   History of Present Illness:  Fernando Nelson is a 59 yo with a recent episode of CP. He was admitted with unstable angina. He was found to have a tight right coronary artery stenosis and had placement of a 3.5 mm drug-eluting stent to the RCA.  He has had twinges of chest pain that last only several seconds.  He is exercising 4 times a week.  Feb. 13, 2015:  Fernando Nelson is doing very well. He is exercising without any chest pains or shortness breath. He is enrolled in the EVOLVE  II stent study.  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  Dr. Buddy Duty is following his lipid levels.  Nov. 22, 2016  Fernando Nelson is seen today for follow up of his CAD Doing well.  No angina  Exercising  -  Has some right sided pain , constant , not similar to his presenting pain  Will talk to his primary medical doctor  Dec. 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    No angina.   Exercising , gym 3 days a week .     Has lost some weight.   Dec 27, 2019:;  Still working at home . Trying to exercise  Wt is 230.   has gained 21 lbs this year  Has not done anything this past year due to covid.  21 members of his family have passed away due to Covid this past year No CP, no dyspnea  Current Outpatient Medications on File Prior to Visit  Medication Sig Dispense Refill  . aspirin EC 81 MG tablet Take 81 mg by mouth every morning.    Marland Kitchen atorvastatin  (LIPITOR) 80 MG tablet Take 1 tablet (80 mg total) by mouth daily. 90 tablet 0  . ibuprofen (ADVIL) 800 MG tablet Take 800 mg by mouth every 8 (eight) hours as needed.    . insulin lispro (HUMALOG KWIKPEN) 200 UNIT/ML KwikPen Inject 20 Units into the skin daily. 20 to 36 units at meals, up to 60 units a day    . metoprolol tartrate (LOPRESSOR) 25 MG tablet Take 1 tablet (25 mg total) by mouth 2 (two) times daily. 180 tablet 3  . Multiple Vitamin (MULTIVITAMIN WITH MINERALS) TABS Take 1 tablet by mouth daily.    . nitroGLYCERIN (NITROSTAT) 0.4 MG SL tablet Place 1 tablet (0.4 mg total) under the tongue every 5 (five) minutes x 3 doses as needed for chest pain. 25 tablet 6  . omega-3 acid ethyl esters (LOVAZA) 1 g capsule Take 1 capsule (1 g total) by mouth daily. 90 capsule 1  . TOUJEO SOLOSTAR 300 UNIT/ML Solostar Pen Inject 70 Units into the skin daily.    . valACYclovir (VALTREX) 500 MG tablet Take 500 mg by mouth 2 (two) times daily.  No current facility-administered medications on file prior to visit.    No Known Allergies  Past Medical History:  Diagnosis Date  . CAD (coronary artery disease)    a. [place] 02/2012 - s/p PTCA/Evolve II study stent to RCA 03/23/12, residual LAD dz  . Hepatitis B ~ 2000   "donated blood to [employer]; got letter; had shots over 6 months; gone now"  . Hyperlipemia   . Type II diabetes mellitus (HCC)     Past Surgical History:  Procedure Laterality Date  . CORONARY ANGIOPLASTY WITH STENT PLACEMENT  03/23/12   "1; first one ever"  . LEFT HEART CATHETERIZATION WITH CORONARY ANGIOGRAM N/A 03/23/2012   Procedure: LEFT HEART CATHETERIZATION WITH CORONARY ANGIOGRAM;  Surgeon: Dolores Patty, MD;  Location: [facility] CATH LAB;  Service: Cardiovascular;  Laterality: N/A;  . PERCUTANEOUS CORONARY STENT INTERVENTION (PCI-S)  03/23/2012   Procedure: PERCUTANEOUS CORONARY STENT INTERVENTION (PCI-S);  Surgeon: Dolores Patty, MD;  Location: [facility] CATH LAB;  Service:  Cardiovascular;;  . SHOULDER OPEN ROTATOR CUFF REPAIR  2005   left    Social History   Tobacco Use  Smoking Status Former Smoker  . Packs/day: 1.00  . Years: 25.00  . Pack years: 25.00  . Types: Cigarettes  . Quit date: 09/22/2009  . Years since quitting: 10.2  Smokeless Tobacco Never Used    Social History   Substance and Sexual Activity  Alcohol Use No    Family History  Problem Relation Age of Onset  . Heart failure Mother     Reviw of Systems:  Reviewed in the HPI.  All other systems are negative.  Physical Exam: Blood pressure 114/70, pulse 78, height 5' 6.5" (1.689 m), weight 230 lb (104.3 kg), SpO2 96 %.  GEN:   Middle age male,  NAD obese  HEENT: Normal NECK: No JVD; No carotid bruits LYMPHATICS: No lymphadenopathy CARDIAC: RRR , no murmurs, rubs, gallops RESPIRATORY:  Clear to auscultation without rales, wheezing or rhonchi  ABDOMEN: Soft, non-tender, non-distended MUSCULOSKELETAL:  No edema; No deformity  SKIN: Warm and dry NEUROLOGIC:  Alert and oriented x 3   ECG: Dec 27, 2019:  NSR at 25.  NS ST elevation    Assessment / Plan:   1. Coronary Artery disease- s/p 3.5 EVOLVE II study stent by 20 mm to the RCA-  No angina .   Cont meds.    2. Diabetes Mellitus- managed by his primary   3. Hyperlipidemia / hypertriglyceridemia -continue with atorvastatin.  I recommended weight loss.  Will check lipids, liver enzymes, basic metabolic profile today.      Kristeen Miss, MD  12/27/2019 8:45 AM    [facility] [address] [place],  [address] [place], [place]  [address] Pager [phone] Phone: [phone]; Fax: [phone]

## 2019-12-27 NOTE — Patient Instructions (Signed)
Medication Instructions:  Your physician recommends that you continue on your current medications as directed. Please refer to the Current Medication list given to you today.  *If you need a refill on your cardiac medications before your next appointment, please call your pharmacy*   Lab Work: TODAY - cholesterol, liver panel, basic metabolic panel If you have labs (blood work) drawn today and your tests are completely normal, you will receive your results only by: MyChart Message (if you have MyChart) OR A paper copy in the mail If you have any lab test that is abnormal or we need to change your treatment, we will call you to review the results.   Testing/Procedures: None Ordered   Follow-Up: At [facility], you and your health needs are our priority.  As part of our continuing mission to provide you with exceptional heart care, we have created designated Provider Care Teams.  These Care Teams include your primary Cardiologist (physician) and Advanced Practice Providers (APPs -  Physician Assistants and Nurse Practitioners) who all work together to provide you with the care you need, when you need it.  Your next appointment:   1 year(s)  The format for your next appointment:   In Person  Provider:   You may see Philip Nahser, MD or one of the following Advanced Practice Providers on your designated Care Team:   Scott Weaver, PA-C Vin Bhagat, PA-C   

## 2019-12-31 ENCOUNTER — Other Ambulatory Visit: Payer: Self-pay

## 2019-12-31 ENCOUNTER — Other Ambulatory Visit: Payer: Self-pay | Admitting: Nurse Practitioner

## 2019-12-31 MED ORDER — OMEGA-3-ACID ETHYL ESTERS 1 G PO CAPS: 1.0000 g | ORAL_CAPSULE | Freq: Every day | ORAL | 3 refills | Status: DC

## 2019-12-31 MED ORDER — ATORVASTATIN CALCIUM 80 MG PO TABS: 80.0000 mg | ORAL_TABLET | Freq: Every day | ORAL | 3 refills | Status: DC

## 2020-10-07 ENCOUNTER — Encounter (INDEPENDENT_AMBULATORY_CARE_PROVIDER_SITE_OTHER): Payer: Self-pay | Admitting: Family Medicine

## 2020-10-07 ENCOUNTER — Other Ambulatory Visit: Payer: Self-pay

## 2020-10-07 ENCOUNTER — Ambulatory Visit (INDEPENDENT_AMBULATORY_CARE_PROVIDER_SITE_OTHER): Payer: Commercial Managed Care - PPO | Admitting: Family Medicine

## 2020-10-07 VITALS — BP 128/75 | HR 87 | Temp 98.0°F | Ht 67.0 in | Wt 244.0 lb

## 2020-10-07 DIAGNOSIS — E1159 Type 2 diabetes mellitus with other circulatory complications: Secondary | ICD-10-CM | POA: Diagnosis not present

## 2020-10-07 DIAGNOSIS — Z6838 Body mass index (BMI) 38.0-38.9, adult: Secondary | ICD-10-CM

## 2020-10-07 DIAGNOSIS — R5383 Other fatigue: Secondary | ICD-10-CM | POA: Diagnosis not present

## 2020-10-07 DIAGNOSIS — R0602 Shortness of breath: Secondary | ICD-10-CM | POA: Diagnosis not present

## 2020-10-07 DIAGNOSIS — E785 Hyperlipidemia, unspecified: Secondary | ICD-10-CM

## 2020-10-07 DIAGNOSIS — E1169 Type 2 diabetes mellitus with other specified complication: Secondary | ICD-10-CM | POA: Diagnosis not present

## 2020-10-07 DIAGNOSIS — Z9189 Other specified personal risk factors, not elsewhere classified: Secondary | ICD-10-CM | POA: Diagnosis not present

## 2020-10-07 DIAGNOSIS — Z0289 Encounter for other administrative examinations: Secondary | ICD-10-CM

## 2020-10-07 DIAGNOSIS — I152 Hypertension secondary to endocrine disorders: Secondary | ICD-10-CM

## 2020-10-07 DIAGNOSIS — Z1331 Encounter for screening for depression: Secondary | ICD-10-CM | POA: Diagnosis not present

## 2020-10-07 DIAGNOSIS — E559 Vitamin D deficiency, unspecified: Secondary | ICD-10-CM | POA: Diagnosis not present

## 2020-10-07 DIAGNOSIS — Z794 Long term (current) use of insulin: Secondary | ICD-10-CM

## 2020-10-08 LAB — COMPREHENSIVE METABOLIC PANEL
ALT: 29 IU/L (ref 0–44)
AST: 16 IU/L (ref 0–40)
Albumin/Globulin Ratio: 1.4 (ref 1.2–2.2)
Albumin: 3.9 g/dL (ref 3.8–4.9)
Alkaline Phosphatase: 102 IU/L (ref 44–121)
BUN/Creatinine Ratio: 18 (ref 9–20)
BUN: 15 mg/dL (ref 6–24)
Bilirubin Total: <0.2 mg/dL (ref 0.0–1.2)
CO2: 19 mmol/L — ABNORMAL LOW (ref 20–29)
Calcium: 9 mg/dL (ref 8.7–10.2)
Chloride: 108 mmol/L — ABNORMAL HIGH (ref 96–106)
Creatinine, Ser: 0.82 mg/dL (ref 0.76–1.27)
GFR calc Af Amer: 113 mL/min/{1.73_m2} (ref >59)
GFR calc non Af Amer: 97 mL/min/{1.73_m2} (ref >59)
Globulin, Total: 2.8 g/dL (ref 1.5–4.5)
Glucose: 74 mg/dL (ref 65–99)
Potassium: 4.5 mmol/L (ref 3.5–5.2)
Sodium: 142 mmol/L (ref 134–144)
Total Protein: 6.7 g/dL (ref 6.0–8.5)

## 2020-10-08 LAB — CBC WITH DIFFERENTIAL/PLATELET
Basophils Absolute: 0.1 10*3/uL (ref 0.0–0.2)
Basos: 0 %
EOS (ABSOLUTE): 0.1 10*3/uL (ref 0.0–0.4)
Eos: 1 %
Hematocrit: 48.1 % (ref 37.5–51.0)
Hemoglobin: 15.7 g/dL (ref 13.0–17.7)
Immature Grans (Abs): 0 10*3/uL (ref 0.0–0.1)
Immature Granulocytes: 0 %
Lymphocytes Absolute: 5.1 10*3/uL — ABNORMAL HIGH (ref 0.7–3.1)
Lymphs: 37 %
MCH: 28.9 pg (ref 26.6–33.0)
MCHC: 32.6 g/dL (ref 31.5–35.7)
MCV: 88 fL (ref 79–97)
Monocytes Absolute: 0.9 10*3/uL (ref 0.1–0.9)
Monocytes: 7 %
Neutrophils Absolute: 7.5 10*3/uL — ABNORMAL HIGH (ref 1.4–7.0)
Neutrophils: 55 %
Platelets: 252 10*3/uL (ref 150–450)
RBC: 5.44 x10E6/uL (ref 4.14–5.80)
RDW: 14.1 % (ref 11.6–15.4)
WBC: 13.7 10*3/uL — ABNORMAL HIGH (ref 3.4–10.8)

## 2020-10-08 LAB — TSH: TSH: 4.28 u[IU]/mL (ref 0.450–4.500)

## 2020-10-08 LAB — VITAMIN D 25 HYDROXY (VIT D DEFICIENCY, FRACTURES): Vit D, 25-Hydroxy: 23.9 ng/mL — ABNORMAL LOW (ref 30.0–100.0)

## 2020-10-08 LAB — VITAMIN B12: Vitamin B-12: 516 pg/mL (ref 232–1245)

## 2020-10-08 LAB — T4: T4, Total: 7.7 ug/dL (ref 4.5–12.0)

## 2020-10-08 LAB — FOLATE: Folate: >20 ng/mL (ref >3.0)

## 2020-10-08 LAB — T3: T3, Total: 149 ng/dL (ref 71–180)

## 2020-10-08 LAB — C-PEPTIDE: C-Peptide: 1.4 ng/mL (ref 1.1–4.4)

## 2020-10-08 LAB — HEMOGLOBIN A1C
Est. average glucose Bld gHb Est-mCnc: 249 mg/dL
Hgb A1c MFr Bld: 10.3 % — ABNORMAL HIGH (ref 4.8–5.6)

## 2020-10-12 NOTE — Progress Notes (Unsigned)
Dear Fernando Graham, PA-C and Dr. Tiburcio Pea,   Thank you for referring Fernando Nelson to our clinic. The following note includes my evaluation and treatment recommendations.  Chief Complaint:   OBESITY Fernando Nelson (MR# [id number]) is a 60 y.o. male who presents for evaluation and treatment of obesity and related comorbidities. Current BMI is Body mass index is 38.22 kg/m. Fernando Nelson has been struggling with his weight for many years and has been unsuccessful in either losing weight, maintaining weight loss, or reaching his healthy weight goal.  Fernando Nelson is currently in the action stage of change and ready to dedicate time achieving and maintaining a healthier weight. Fernando Nelson is interested in becoming our patient and working on intensive lifestyle modifications including (but not limited to) diet and exercise for weight loss.  Fernando Nelson was referred by Dr. Tiburcio Pea and PA Fernando Nelson. He has tried diets in the past. His meals consist of the following: Breakfast- shredded wheat and banana (5 cups wheat), skim milk (drink after) (felt full); Lunch- cucumber, tomato salad with [place] dressing (satisfied) and milk; snack- ruffle potato chips (1/4 bag) (wanted snack) 1 microwave bag of popcorn (wanted to snack); Dinner- [employer] 4 breadsticks, spaghetti with meat sauce (1/4 leftover), 3 helpings of salad (felt full), no dessert.  Fernando Nelson's habits were reviewed today and are as follows: his desired weight loss is 84 lbs, he started gaining weight after turning 60 years old, his heaviest weight ever was 245 pounds, he has significant food cravings issues, he snacks frequently in the evenings, he skips meals frequently, he is frequently drinking liquids with calories, he frequently makes poor food choices, he has problems with excessive hunger, he frequently eats larger portions than normal, he has binge eating behaviors and he struggles with emotional eating.  Depression Screen Fernando Nelson's Food and Mood  (modified PHQ-9) score was 20.  Depression screen [facility] 2/9 10/07/2020  Decreased Interest 3  Down, Depressed, Hopeless 2  PHQ - 2 Score 5  Altered sleeping 3  Tired, decreased energy 3  Change in appetite 3  Feeling bad or failure about yourself  3  Trouble concentrating 3  Moving slowly or fidgety/restless 0  Suicidal thoughts 0  PHQ-9 Score 20  Difficult doing work/chores Somewhat difficult   Subjective:   1. Other fatigue Fernando Nelson admits to daytime somnolence and admits to waking up still tired. Patent has a history of symptoms of daytime fatigue. Fernando Nelson generally gets (depends on the night) hours of sleep per night, and states that he has poor sleep quality. Snoring is present. Apneic episodes are not present. Epworth Sleepiness Score is 14. EKG normal sinus rhythm at 87 bpm  2. SOB (shortness of breath) on exertion Fernando Nelson notes increasing shortness of breath with exercising and seems to be worsening over time with weight gain. He notes getting out of breath sooner with activity than he used to. This has gotten worse recently. Fernando Nelson denies shortness of breath at rest or orthopnea. EKG normal sinus rhythm at 87 bpm  3. Type 2 diabetes mellitus with other specified complication, with long-term current use of insulin (HCC) Fernando Nelson's fating blood sugars run 150-180. He just had an eye exam and Dr. [name] checks his feet at appointments. He takes 60 units of Humalog daily. He was diagnosed with type 2 diabetes over 5 years ago. He is on Trulicity, Toujeo, and Humalog.  4. Hyperlipidemia associated with type 2 diabetes mellitus (HCC) Fernando Nelson is on atorvastatin. He denies myalgias. His last FLP  in November 2021 resulted triglycerides >300 and LDL at goal of <70.  5. Hypertension associated with diabetes [facility](HCC) This is a new diagnosis. Fernando Nelson is on Metoprolol BID.   BP Readings from Last 3 Encounters:  10/07/20 128/75  12/27/19 114/70  12/31/18 121/73    6. Vitamin D deficiency This  diagnosis is likely given pt's obesity. He is on a daily multivitamin.   7. At risk for hypoglycemia Fernando Nelson is at increased risk for hypoglycemia due to changes in diet, diagnosis of diabetes, and/or insulin use. Fernando Nelson is currently taking insulin.    Assessment/Plan:   1. Other fatigue Fernando Nelson does feel that his weight is causing his energy to be lower than it should be. Fatigue may be related to obesity, depression or many other causes. Labs will be ordered, and in the meanwhile, Fernando Nelson will focus on self care including making healthy food choices, increasing physical activity and focusing on stress reduction. Check labs today.  - EKG 12-Lead - Vitamin B12 - Folate - CBC with Differential/Platelet  2. SOB (shortness of breath) on exertion Fernando Nelson does feel that he gets out of breath more easily that he used to when he exercises. Jonnathan's shortness of breath appears to be obesity related and exercise induced. He has agreed to work on weight loss and gradually increase exercise to treat his exercise induced shortness of breath. Will continue to monitor closely.  3. Type 2 diabetes mellitus with other specified complication, with long-term current use of insulin (HCC) [name] to decrease the likelihood of diabetic complications such as nephropathy, neuropathy, limb loss, blindness, coronary artery disease, and death. Intensive lifestyle modification including diet, exercise and weight loss are the first line of treatment for diabetes. Check labs today.  - Comprehensive metabolic panel - Hemoglobin A1c - C-peptide  4. Hyperlipidemia associated with type 2 diabetes mellitus (HCC) Cardiovascular risk and specific lipid/LDL goals reviewed.  We discussed several lifestyle modifications today and Fernando Nelson will continue to work on diet, exercise and weight loss efforts. Orders and follow up as documented in patient record. Check labs in   Counseling Intensive lifestyle  modifications are the first line treatment for this issue. . Dietary changes: Increase soluble fiber. Decrease simple carbohydrates. . Exercise changes: Moderate to vigorous-intensity aerobic activity 150 minutes per week if tolerated. . Lipid-lowering medications: see documented in medical record.  5. Hypertension associated with diabetes (HCC)  months. - T3 - T4 - TSH  6. Vitamin D deficiency Low Vitamin D level contributes to fatigue and are associated with obesity, breast, and colon cancer. He agrees to take daily multivitamin and follow-up for routine testing of Vitamin D, at least 2-3 times per year to avoid over-replacement. Check labs today.  - VITAMIN D 25 Hydroxy (Vit-D Deficiency, Fractures)  7. Depression screening Fernando Nelson had a positive depression screening. Depression is commonly associated with obesity and often results in emotional eating behaviors. We will monitor this closely and work on CBT to help improve the non-hunger eating patterns. Referral to Psychology may be required if no improvement is seen as he continues in our clinic.  8. At risk for hypoglycemia Fernando Nelson was given approximately 15 minutes of counseling today regarding prevention of hypoglycemia. He was advised of symptoms of hypoglycemia. Fernando Nelson was instructed to avoid skipping meals, eat regular protein rich meals and schedule low calorie snacks as needed.   Repetitive spaced learning was employed today to elicit superior memory formation and behavioral change  9. Class 2 severe  obesity with serious comorbidity and body mass index (BMI) of 38.0 to 38.9 in adult, unspecified obesity type (HCC) Fernando Nelson is currently in the action stage of change and his goal is to continue with weight loss efforts. I recommend Fernando Nelson begin the structured treatment plan as follows:  He has agreed to the Category 3 Plan.  Exercise goals: No exercise has been prescribed at this time.   Behavioral modification strategies:  increasing lean protein intake, meal planning and cooking strategies and planning for success.  He was informed of the importance of frequent follow-up visits to maximize his success with intensive lifestyle modifications for his multiple health conditions. He was informed we would discuss his lab results at his next visit unless there is a critical issue that needs to be addressed sooner. Fernando Nelson agreed to keep his next visit at the agreed upon time to discuss these results.  Objective:   Blood pressure 128/75, pulse 87, temperature 98 F (36.7 C), temperature source Oral, height 5\' 7"  (1.702 m), weight 244 lb (110.7 kg), SpO2 97 %. Body mass index is 38.22 kg/m.  EKG: Normal sinus rhythm, rate 87.  Indirect Calorimeter completed today shows a VO2 of 230 and a REE of 1603.  His calculated basal metabolic rate is thus his basal metabolic rate is worse than expected.  General: Cooperative, alert, well developed, in no acute distress. HEENT: Conjunctivae and lids unremarkable. Cardiovascular: Regular rhythm.  Lungs: Normal work of breathing. Neurologic: No focal deficits.  2+ pitting edema  Lab Results  Component Value Date   CREATININE 0.82 10/07/2020   BUN 15 10/07/2020   NA 142 10/07/2020   K 4.5 10/07/2020   CL 108 (H) 10/07/2020   CO2 19 (L) 10/07/2020   Lab Results  Component Value Date   ALT 29 10/07/2020   AST 16 10/07/2020   ALKPHOS 102 10/07/2020   BILITOT <0.2 10/07/2020   Lab Results  Component Value Date   HGBA1C 10.3 (H) 10/07/2020   HGBA1C 11.8 (H) 03/22/2012   No results found for: INSULIN Lab Results  Component Value Date   TSH 4.280 10/07/2020   Lab Results  Component Value Date   CHOL 152 12/27/2019   HDL 31 (L) 12/27/2019   LDLCALC 63 12/27/2019   LDLDIRECT 85.2 10/04/2013   TRIG 372 (H) 12/27/2019   CHOLHDL 4.9 12/27/2019   Lab Results  Component Value Date   WBC 13.7 (H) 10/07/2020   HGB 15.7 10/07/2020   HCT 48.1 10/07/2020   MCV  88 10/07/2020   PLT 252 10/07/2020    Attestation Statements:   Reviewed by clinician on day of visit: allergies, medications, problem list, medical history, surgical history, family history, social history, and previous encounter notes.  10/09/2020, am acting as transcriptionist for Edmund Hilda, MD.  This is the patient's first visit at [facility]. The patient's NEW PATIENT PACKET was reviewed at length. Included in the packet: current and past health history, medications, allergies, ROS, gynecologic history (women only), surgical history, family history, social history, weight history, weight loss surgery history (for those that have had weight loss surgery), nutritional evaluation, mood and food questionnaire, PHQ9, Epworth questionnaire, sleep habits questionnaire, patient life and health improvement goals questionnaire. These will all be scanned into the patient's chart under media.   During the visit, I independently reviewed the patient's EKG, bioimpedance scale results, and indirect calorimeter results. I used this information to tailor a meal plan for the patient that will  help him to lose weight and will improve his obesity-related conditions going forward. I performed a medically necessary appropriate examination and/or evaluation. I discussed the assessment and treatment plan with the patient. The patient was provided an opportunity to ask questions and all were answered. The patient agreed with the plan and demonstrated an understanding of the instructions. Labs were ordered at this visit and will be reviewed at the next visit unless more critical results need to be addressed immediately. Clinical information was updated and documented in the EMR.   Time spent on visit including pre-visit chart review and post-visit care was 45 minutes.   A separate 15 minutes was spent on risk counseling (see above).   I have reviewed the above documentation for accuracy  and completeness, and I agree with the above. - Katherina Mires, MD

## 2020-10-21 ENCOUNTER — Ambulatory Visit (INDEPENDENT_AMBULATORY_CARE_PROVIDER_SITE_OTHER): Payer: Commercial Managed Care - PPO | Admitting: Family Medicine

## 2020-10-21 ENCOUNTER — Other Ambulatory Visit (INDEPENDENT_AMBULATORY_CARE_PROVIDER_SITE_OTHER): Payer: Self-pay | Admitting: Family Medicine

## 2020-10-21 ENCOUNTER — Other Ambulatory Visit: Payer: Self-pay

## 2020-10-21 VITALS — BP 124/74 | HR 86 | Temp 97.8°F | Ht 67.0 in | Wt 242.0 lb

## 2020-10-21 DIAGNOSIS — Z9189 Other specified personal risk factors, not elsewhere classified: Secondary | ICD-10-CM | POA: Diagnosis not present

## 2020-10-21 DIAGNOSIS — D72820 Lymphocytosis (symptomatic): Secondary | ICD-10-CM

## 2020-10-21 DIAGNOSIS — E1169 Type 2 diabetes mellitus with other specified complication: Secondary | ICD-10-CM

## 2020-10-21 DIAGNOSIS — E785 Hyperlipidemia, unspecified: Secondary | ICD-10-CM

## 2020-10-21 DIAGNOSIS — E559 Vitamin D deficiency, unspecified: Secondary | ICD-10-CM

## 2020-10-21 DIAGNOSIS — Z6838 Body mass index (BMI) 38.0-38.9, adult: Secondary | ICD-10-CM

## 2020-10-21 DIAGNOSIS — Z794 Long term (current) use of insulin: Secondary | ICD-10-CM

## 2020-10-21 MED ORDER — VITAMIN D (ERGOCALCIFEROL) 1.25 MG (50000 UNIT) PO CAPS: 50000.0000 [IU] | ORAL_CAPSULE | ORAL | 0 refills | Status: DC

## 2020-10-22 NOTE — Progress Notes (Signed)
Chief Complaint:   OBESITY Fernando Nelson is here to discuss his progress with his obesity treatment plan along with follow-up of his obesity related diagnoses. Fernando Nelson is on the Category 3 Plan and states he is following his eating plan approximately 100% of the time. Fernando Nelson states he is doing 2 miles on the treadmill 3 times per week.  Today's visit was #: 2 Starting weight: 244 lbs Starting date: 10/07/2020 Today's weight: 242 lbs Today's date: 10/21/2020 Total lbs lost to date: 2 lbs Total lbs lost since last in-office visit: 2 lbs  Interim History: Fernando Nelson is doing well on Category 3. He didn't care for the cheese on plan. The quantity was sufficient and borderline too much. He denies hunger but notes cravings for cheeseburgers. He is disappointed with minimal weight loss. Pt has lots of yard work scheduled for the next few weeks.  Subjective:   1. Vitamin D deficiency Fernando Nelson's Vit D level is 23.9. He is not on Vit D supplementation.  2. Hyperlipidemia associated with type 2 diabetes mellitus (HCC) Fernando Nelson's lipid panel: LDL 63, HDL 31, and triglycerides 774. He is on Lipitor 80 mg daily. He denies transaminitis or myalgias.   3. Type 2 diabetes mellitus with other specified complication, with long-term current use of insulin (HCC) Fernando Nelson's BS ranges of 78-255. He decreased Toujeo to 60 units. Humalog 20-25 units. Plans to increase Trulicity to 1.0 mg.   4. Lymphocytosis Fernando Nelson's WBC count is 13.7. His lymphocyte # is slightly elevated. He has a history of elevated WBC.  5. At risk for osteoporosis Fernando Nelson is at higher risk of osteopenia and osteoporosis due to Vitamin D deficiency.   Assessment/Plan:   1. Vitamin D deficiency Low Vitamin D level contributes to fatigue and are associated with obesity, breast, and colon cancer. He agrees to start to take prescription Vitamin D @50 ,000 IU every week and will follow-up for routine testing of Vitamin D, at least 2-3 times per year to  avoid over-replacement.  - Vitamin D, Ergocalciferol, (DRISDOL) 1.25 MG (50000 UNIT) CAPS capsule; Take 1 capsule (50,000 Units total) by mouth every 7 (seven) days.  Dispense: 4 capsule; Refill: 0  2. Hyperlipidemia associated with type 2 diabetes mellitus (HCC) Cardiovascular risk and specific lipid/LDL goals reviewed.  We discussed several lifestyle modifications today and Fernando Nelson will continue to work on diet, exercise and weight loss efforts. Orders and follow up as documented in patient record. Continue Lipitor. Repeat labs in 3 months and consider decrease in statin if cholesterol is still controlled.   Counseling Intensive lifestyle modifications are the first line treatment for this issue. . Dietary changes: Increase soluble fiber. Decrease simple carbohydrates. . Exercise changes: Moderate to vigorous-intensity aerobic activity 150 minutes per week if tolerated. . Lipid-lowering medications: see documented in medical record.  3. Type 2 diabetes mellitus with other specified complication, with long-term current use of insulin (HCC) [name] to decrease the likelihood of diabetic complications such as nephropathy, neuropathy, limb loss, blindness, coronary artery disease, and death. Intensive lifestyle modification including diet, exercise and weight loss are the first line of treatment for diabetes. Follow up BS with increase in Trulicity and continue to decrease insulin. Hypoglycemia plan discussed.  4. Lymphocytosis Repeat labs in 3 months.  5. At risk for osteoporosis Fernando Nelson was given approximately 15 minutes of osteoporosis prevention counseling today. Fernando Nelson is at risk for osteopenia and osteoporosis due to his Vitamin D deficiency. He was encouraged to take his Vitamin  D and follow his higher calcium diet and increase strengthening exercise to help strengthen his bones and decrease his risk of osteopenia and osteoporosis.  Repetitive spaced learning  was employed today to elicit superior memory formation and behavioral change.  6. Class 2 severe obesity with serious comorbidity and body mass index (BMI) of 38.0 to 38.9 in adult, unspecified obesity type (HCC) Fernando Nelson is currently in the action stage of change. As such, his goal is to continue with weight loss efforts. He has agreed to the Category 3 Plan.   Exercise goals: No exercise has been prescribed at this time.  Behavioral modification strategies: increasing lean protein intake, meal planning and cooking strategies, keeping healthy foods in the home and planning for success.  Fernando Nelson has agreed to follow-up with our clinic in 2 weeks. He was informed of the importance of frequent follow-up visits to maximize his success with intensive lifestyle modifications for his multiple health conditions.   Objective:   Blood pressure 124/74, pulse 86, temperature 97.8 F (36.6 C), temperature source Oral, height 5\' 7"  (1.702 m), weight 242 lb (109.8 kg), SpO2 96 %. Body mass index is 37.9 kg/m.  General: Cooperative, alert, well developed, in no acute distress. HEENT: Conjunctivae and lids unremarkable. Cardiovascular: Regular rhythm.  Lungs: Normal work of breathing. Neurologic: No focal deficits.   Lab Results  Component Value Date   CREATININE 0.82 10/07/2020   BUN 15 10/07/2020   NA 142 10/07/2020   K 4.5 10/07/2020   CL 108 (H) 10/07/2020   CO2 19 (L) 10/07/2020   Lab Results  Component Value Date   ALT 29 10/07/2020   AST 16 10/07/2020   ALKPHOS 102 10/07/2020   BILITOT <0.2 10/07/2020   Lab Results  Component Value Date   HGBA1C 10.3 (H) 10/07/2020   HGBA1C 11.8 (H) 03/22/2012   No results found for: INSULIN Lab Results  Component Value Date   TSH 4.280 10/07/2020   Lab Results  Component Value Date   CHOL 152 12/27/2019   HDL 31 (L) 12/27/2019   LDLCALC 63 12/27/2019   LDLDIRECT 85.2 10/04/2013   TRIG 372 (H) 12/27/2019   CHOLHDL 4.9 12/27/2019   Lab  Results  Component Value Date   WBC 13.7 (H) 10/07/2020   HGB 15.7 10/07/2020   HCT 48.1 10/07/2020   MCV 88 10/07/2020   PLT 252 10/07/2020    Attestation Statements:   Reviewed by clinician on day of visit: allergies, medications, problem list, medical history, surgical history, family history, social history, and previous encounter notes.  10/09/2020, am acting as transcriptionist for Edmund Hilda, MD.   I have reviewed the above documentation for accuracy and completeness, and I agree with the above. - Reuben Likes, MD

## 2020-10-25 ENCOUNTER — Other Ambulatory Visit (INDEPENDENT_AMBULATORY_CARE_PROVIDER_SITE_OTHER): Payer: Self-pay | Admitting: Family Medicine

## 2020-10-25 DIAGNOSIS — E559 Vitamin D deficiency, unspecified: Secondary | ICD-10-CM

## 2020-11-05 ENCOUNTER — Ambulatory Visit (INDEPENDENT_AMBULATORY_CARE_PROVIDER_SITE_OTHER): Payer: Commercial Managed Care - PPO | Admitting: Physician Assistant

## 2020-11-05 ENCOUNTER — Encounter (INDEPENDENT_AMBULATORY_CARE_PROVIDER_SITE_OTHER): Payer: Self-pay | Admitting: Physician Assistant

## 2020-11-05 ENCOUNTER — Other Ambulatory Visit: Payer: Self-pay

## 2020-11-05 VITALS — BP 131/77 | HR 83 | Temp 98.3°F | Ht 67.0 in | Wt 237.0 lb

## 2020-11-05 DIAGNOSIS — E1169 Type 2 diabetes mellitus with other specified complication: Secondary | ICD-10-CM

## 2020-11-05 DIAGNOSIS — Z9189 Other specified personal risk factors, not elsewhere classified: Secondary | ICD-10-CM

## 2020-11-05 DIAGNOSIS — Z6838 Body mass index (BMI) 38.0-38.9, adult: Secondary | ICD-10-CM

## 2020-11-05 DIAGNOSIS — Z794 Long term (current) use of insulin: Secondary | ICD-10-CM

## 2020-11-05 DIAGNOSIS — E559 Vitamin D deficiency, unspecified: Secondary | ICD-10-CM | POA: Diagnosis not present

## 2020-11-11 NOTE — Progress Notes (Signed)
Chief Complaint:   OBESITY Fernando Nelson is here to discuss his progress with his obesity treatment plan along with follow-up of his obesity related diagnoses. Fernando Nelson is on the Category 3 Plan and states he is following his eating plan approximately 90% of the time. Fernando Nelson states he is walking for 60 minutes 6 times per week.  Today's visit was #: 3 Starting weight: 244 lbs Starting date: 10/07/2020 Today's weight: 237 lbs Today's date: 11/05/2020 Total lbs lost to date: 7 Total lbs lost since last in-office visit: 5  Interim History: Fernando Nelson did very well with weight loss. He has no issues with the plan overall. He is asking about the best choices of red meat.  Subjective:   1. Type 2 diabetes mellitus with other specified complication, with long-term current use of insulin (HCC) Fernando Nelson is on Trulicity and insulin. He is managed by Dr. [name]. Last A1c was 10.3, and his fasting BGs range between 119 and 251. He denies hypoglycemia.  2. Vitamin D deficiency Fernando Nelson is on Vit D weekly. Last Vit D level was 23.9.  3. At risk for hypoglycemia Fernando Nelson is at increased risk for hypoglycemia due to changes in diet, diagnosis of diabetes, and/or insulin use.   Assessment/Plan:   1. Type 2 diabetes mellitus with other specified complication, with long-term current use of insulin (HCC) [name] to decrease the likelihood of diabetic complications such as nephropathy, neuropathy, limb loss, blindness, coronary artery disease, and death. Intensive lifestyle modification including diet, exercise and weight loss are the first line of treatment for diabetes. Fernando Nelson will continue to follow up with Dr. [name].  2. Vitamin D deficiency Low Vitamin D level contributes to fatigue and are associated with obesity, breast, and colon cancer. We will refill prescription Vitamin D for 1 month. Fernando Nelson will follow-up for routine testing of Vitamin D, at least 2-3 times per year to avoid  over-replacement.  3. At risk for hypoglycemia Fernando Nelson was given approximately 15 minutes of counseling today regarding prevention of hypoglycemia. He was advised of symptoms of hypoglycemia. Fernando Nelson was instructed to avoid skipping meals, eat regular protein rich meals and schedule low calorie snacks as needed.   Repetitive spaced learning was employed today to elicit superior memory formation and behavioral change  4. Class 2 severe obesity with serious comorbidity and body mass index (BMI) of 38.0 to 38.9 in adult, unspecified obesity type (HCC) Fernando Nelson is currently in the action stage of change. As such, his goal is to continue with weight loss efforts. He has agreed to the Category 3 Plan.   Exercise goals: As is.  Behavioral modification strategies: meal planning and cooking strategies and keeping healthy foods in the home.  Fernando Nelson has agreed to follow-up with our clinic in 2 weeks. He was informed of the importance of frequent follow-up visits to maximize his success with intensive lifestyle modifications for his multiple health conditions.   Objective:   Blood pressure 131/77, pulse 83, temperature 98.3 F (36.8 C), height 5\' 7"  (1.702 m), weight 237 lb (107.5 kg), SpO2 97 %. Body mass index is 37.12 kg/m.  General: Cooperative, alert, well developed, in no acute distress. HEENT: Conjunctivae and lids unremarkable. Cardiovascular: Regular rhythm.  Lungs: Normal work of breathing. Neurologic: No focal deficits.   Lab Results  Component Value Date   CREATININE 0.82 10/07/2020   BUN 15 10/07/2020   NA 142 10/07/2020   K 4.5 10/07/2020   CL 108 (H) 10/07/2020  CO2 19 (L) 10/07/2020   Lab Results  Component Value Date   ALT 29 10/07/2020   AST 16 10/07/2020   ALKPHOS 102 10/07/2020   BILITOT <0.2 10/07/2020   Lab Results  Component Value Date   HGBA1C 10.3 (H) 10/07/2020   HGBA1C 11.8 (H) 03/22/2012   No results found for: INSULIN Lab Results  Component Value Date    TSH 4.280 10/07/2020   Lab Results  Component Value Date   CHOL 152 12/27/2019   HDL 31 (L) 12/27/2019   LDLCALC 63 12/27/2019   LDLDIRECT 85.2 10/04/2013   TRIG 372 (H) 12/27/2019   CHOLHDL 4.9 12/27/2019   Lab Results  Component Value Date   WBC 13.7 (H) 10/07/2020   HGB 15.7 10/07/2020   HCT 48.1 10/07/2020   MCV 88 10/07/2020   PLT 252 10/07/2020   No results found for: IRON, TIBC, FERRITIN  Attestation Statements:   Reviewed by clinician on day of visit: allergies, medications, problem list, medical history, surgical history, family history, social history, and previous encounter notes.   Trude Mcburney, am acting as transcriptionist for [employer], PA-C.  I have reviewed the above documentation for accuracy and completeness, and I agree with the above. Alois Cliche, PA-C

## 2020-11-19 ENCOUNTER — Ambulatory Visit (INDEPENDENT_AMBULATORY_CARE_PROVIDER_SITE_OTHER): Payer: Commercial Managed Care - PPO | Admitting: Physician Assistant

## 2020-11-19 ENCOUNTER — Encounter (INDEPENDENT_AMBULATORY_CARE_PROVIDER_SITE_OTHER): Payer: Self-pay | Admitting: Physician Assistant

## 2020-11-19 ENCOUNTER — Other Ambulatory Visit: Payer: Self-pay

## 2020-11-19 VITALS — BP 130/79 | HR 80 | Temp 97.6°F | Ht 67.0 in | Wt 238.0 lb

## 2020-11-19 DIAGNOSIS — E1169 Type 2 diabetes mellitus with other specified complication: Secondary | ICD-10-CM | POA: Diagnosis not present

## 2020-11-19 DIAGNOSIS — Z9189 Other specified personal risk factors, not elsewhere classified: Secondary | ICD-10-CM | POA: Diagnosis not present

## 2020-11-19 DIAGNOSIS — Z6838 Body mass index (BMI) 38.0-38.9, adult: Secondary | ICD-10-CM

## 2020-11-19 DIAGNOSIS — E559 Vitamin D deficiency, unspecified: Secondary | ICD-10-CM

## 2020-11-19 DIAGNOSIS — R0602 Shortness of breath: Secondary | ICD-10-CM | POA: Diagnosis not present

## 2020-11-19 DIAGNOSIS — Z794 Long term (current) use of insulin: Secondary | ICD-10-CM

## 2020-11-19 MED ORDER — VITAMIN D (ERGOCALCIFEROL) 1.25 MG (50000 UNIT) PO CAPS: 50000.0000 [IU] | ORAL_CAPSULE | ORAL | 0 refills | Status: DC

## 2020-11-21 ENCOUNTER — Other Ambulatory Visit: Payer: Self-pay | Admitting: Cardiovascular Disease

## 2020-11-24 NOTE — Progress Notes (Signed)
Chief Complaint:   OBESITY Fernando Nelson is here to discuss his progress with his obesity treatment plan along with follow-up of his obesity related diagnoses. Fernando Nelson is on the Category 3 Plan and states he is following his eating plan approximately 99% of the time. Fernando Nelson states he is on the treadmill for 60 minutes 7 times per week.  Today's visit was #: 4 Starting weight: 244 lbs Starting date: 10/07/2020 Today's weight: 238 lbs Today's date: 11/19/2020 Total lbs lost to date: 6 Total lbs lost since last in-office visit: 0  Interim History: Fernando Nelson's IC shows an increase in RMR to 2258. He is doing well getting in most of the food on his Category 3 without the peanut butter toast in the morning. He is traveling to [place] in a few weeks.  Subjective:   1. SOB (shortness of breath) on exertion Kha notes shortness of breath with exertion. He denies dizziness or lightheadedness.  2. Vitamin D deficiency Fernando Nelson is on Vit D, and he denies nausea, vomiting, or muscle weakness.  3. Type 2 diabetes mellitus with other specified complication, with long-term current use of insulin (HCC) Fernando Nelson denies hypoglycemia. He is managed by Dr. [name]. He rarely takes his daytime insulin due to lower BGs.  4. At risk for hypoglycemia Fernando Nelson is at increased risk for hypoglycemia due to changes in diet, diagnosis of diabetes, and/or insulin use.   Assessment/Plan:   1. SOB (shortness of breath) on exertion Fernando Nelson has agreed to work on weight loss and gradually increase exercise to treat his exercise induced shortness of breath. IC was repeated today, and results were discussed with the patient. Will continue to monitor closely.  2. Vitamin D deficiency Low Vitamin D level contributes to fatigue and are associated with obesity, breast, and colon cancer. We will refill prescription Vitamin D for 1 month. Fernando Nelson will follow-up for routine testing of Vitamin D, at least 2-3 times per year to avoid  over-replacement.  - Vitamin D, Ergocalciferol, (DRISDOL) 1.25 MG (50000 UNIT) CAPS capsule; Take 1 capsule (50,000 Units total) by mouth every 7 (seven) days.  Dispense: 4 capsule; Refill: 0  3. Type 2 diabetes mellitus with other specified complication, with long-term current use of insulin (HCC) [name] to decrease the likelihood of diabetic complications such as nephropathy, neuropathy, limb loss, blindness, coronary artery disease, and death. Intensive lifestyle modification including diet, exercise and weight loss are the first line of treatment for diabetes. Fernando Nelson will follow up with Dr. [name], and will continue his meal plan.  4. At risk for hypoglycemia Fernando Nelson was given approximately 15 minutes of counseling today regarding prevention of hypoglycemia. He was advised of symptoms of hypoglycemia. Fernando Nelson was instructed to avoid skipping meals, eat regular protein rich meals and schedule low calorie snacks as needed.   Repetitive spaced learning was employed today to elicit superior memory formation and behavioral change  5. Class 2 severe obesity with serious comorbidity and body mass index (BMI) of 38.0 to 38.9 in adult, unspecified obesity type (HCC) Fernando Nelson is currently in the action stage of change. As such, his goal is to continue with weight loss efforts. He has agreed to change to the Category 4 Plan.   Exercise goals: As is.  Behavioral modification strategies: meal planning and cooking strategies and keeping healthy foods in the home.  Fernando Nelson has agreed to follow-up with our clinic in 2 weeks. He was informed of the importance of frequent follow-up visits to maximize  his success with intensive lifestyle modifications for his multiple health conditions.   Objective:   Blood pressure 130/79, pulse 80, temperature 97.6 F (36.4 C), height 5\' 7"  (1.702 m), weight 238 lb (108 kg), SpO2 97 %. Body mass index is 37.28 kg/m.  General: Cooperative,  alert, well developed, in no acute distress. HEENT: Conjunctivae and lids unremarkable. Cardiovascular: Regular rhythm.  Lungs: Normal work of breathing. Neurologic: No focal deficits.   Lab Results  Component Value Date   CREATININE 0.82 10/07/2020   BUN 15 10/07/2020   NA 142 10/07/2020   K 4.5 10/07/2020   CL 108 (H) 10/07/2020   CO2 19 (L) 10/07/2020   Lab Results  Component Value Date   ALT 29 10/07/2020   AST 16 10/07/2020   ALKPHOS 102 10/07/2020   BILITOT <0.2 10/07/2020   Lab Results  Component Value Date   HGBA1C 10.3 (H) 10/07/2020   HGBA1C 11.8 (H) 03/22/2012   No results found for: INSULIN Lab Results  Component Value Date   TSH 4.280 10/07/2020   Lab Results  Component Value Date   CHOL 152 12/27/2019   HDL 31 (L) 12/27/2019   LDLCALC 63 12/27/2019   LDLDIRECT 85.2 10/04/2013   TRIG 372 (H) 12/27/2019   CHOLHDL 4.9 12/27/2019   Lab Results  Component Value Date   WBC 13.7 (H) 10/07/2020   HGB 15.7 10/07/2020   HCT 48.1 10/07/2020   MCV 88 10/07/2020   PLT 252 10/07/2020   No results found for: IRON, TIBC, FERRITIN  Attestation Statements:   Reviewed by clinician on day of visit: allergies, medications, problem list, medical history, surgical history, family history, social history, and previous encounter notes.   10/09/2020, am acting as transcriptionist for Trude Mcburney, PA-C.  I have reviewed the above documentation for accuracy and completeness, and I agree with the above. [employer], PA-C

## 2020-12-16 ENCOUNTER — Other Ambulatory Visit: Payer: Self-pay | Admitting: Cardiovascular Disease

## 2020-12-17 ENCOUNTER — Other Ambulatory Visit (INDEPENDENT_AMBULATORY_CARE_PROVIDER_SITE_OTHER): Payer: Self-pay | Admitting: Physician Assistant

## 2020-12-17 ENCOUNTER — Encounter (INDEPENDENT_AMBULATORY_CARE_PROVIDER_SITE_OTHER): Payer: Self-pay | Admitting: Physician Assistant

## 2020-12-17 ENCOUNTER — Telehealth: Payer: Self-pay | Admitting: Cardiovascular Disease

## 2020-12-17 ENCOUNTER — Other Ambulatory Visit: Payer: Self-pay

## 2020-12-17 ENCOUNTER — Ambulatory Visit (INDEPENDENT_AMBULATORY_CARE_PROVIDER_SITE_OTHER): Payer: Commercial Managed Care - PPO | Admitting: Physician Assistant

## 2020-12-17 VITALS — BP 130/82 | HR 89 | Temp 97.8°F | Ht 67.0 in | Wt 229.0 lb

## 2020-12-17 DIAGNOSIS — E1169 Type 2 diabetes mellitus with other specified complication: Secondary | ICD-10-CM

## 2020-12-17 DIAGNOSIS — Z794 Long term (current) use of insulin: Secondary | ICD-10-CM | POA: Diagnosis not present

## 2020-12-17 DIAGNOSIS — E559 Vitamin D deficiency, unspecified: Secondary | ICD-10-CM

## 2020-12-17 DIAGNOSIS — Z9189 Other specified personal risk factors, not elsewhere classified: Secondary | ICD-10-CM | POA: Diagnosis not present

## 2020-12-17 DIAGNOSIS — Z6838 Body mass index (BMI) 38.0-38.9, adult: Secondary | ICD-10-CM

## 2020-12-17 MED ORDER — OMEGA-3-ACID ETHYL ESTERS 1 G PO CAPS: 1.0000 g | ORAL_CAPSULE | Freq: Every day | ORAL | 0 refills | Status: DC

## 2020-12-17 MED ORDER — ATORVASTATIN CALCIUM 80 MG PO TABS: 80.0000 mg | ORAL_TABLET | Freq: Every day | ORAL | 0 refills | Status: DC

## 2020-12-17 MED ORDER — VITAMIN D (ERGOCALCIFEROL) 1.25 MG (50000 UNIT) PO CAPS: 50000.0000 [IU] | ORAL_CAPSULE | ORAL | 0 refills | Status: DC

## 2020-12-17 MED ORDER — METOPROLOL TARTRATE 25 MG PO TABS: ORAL_TABLET | ORAL | 0 refills | Status: DC

## 2020-12-17 NOTE — Telephone Encounter (Signed)
Pt's medication was sent to pt's pharmacy as requested. Confirmation received.  °

## 2020-12-17 NOTE — Telephone Encounter (Signed)
*  STAT* If patient is at the pharmacy, call can be transferred to refill team.   1. Which medications need to be refilled? (please list name of each medication and dose if known) atorvastatin (LIPITOR) 80 MG tablet, metoprolol tartrate (LOPRESSOR) 25 MG tablet, omega-3 acid ethyl esters (LOVAZA) 1 g capsule  2. Which pharmacy/location (including street and city if local pharmacy) is medication to be sent to? [id number] - [place], [place] - [address]  3. Do they need a 30 day or 90 day supply? 90

## 2020-12-20 ENCOUNTER — Other Ambulatory Visit (INDEPENDENT_AMBULATORY_CARE_PROVIDER_SITE_OTHER): Payer: Self-pay | Admitting: Physician Assistant

## 2020-12-20 DIAGNOSIS — E559 Vitamin D deficiency, unspecified: Secondary | ICD-10-CM

## 2020-12-21 NOTE — Telephone Encounter (Signed)
Pt last seen by Tracey Aguilar, PA-C.  

## 2020-12-22 NOTE — Progress Notes (Signed)
Chief Complaint:   OBESITY Fernando Nelson is here to discuss his progress with his obesity treatment plan along with follow-up of his obesity related diagnoses. Carry is on the Category 4 Plan and states he is following his eating plan approximately 100% of the time. [name] states he is walking for 60 minutes 7 times per week.  Today's visit was #: 5 Starting weight: 244 lbs Starting date: 10/07/2020 Today's weight: 229 lbs Today's date: 12/17/2020 Total lbs lost to date: 15 Total lbs lost since last in-office visit: 9  Interim History: [name] did well with weight loss. He has been breaking up his meals, eating 5 times per day. His hunger is controlled.  Subjective:   1. Type 2 diabetes mellitus with other specified complication, with long-term current use of insulin (HCC) [name] is managed by Dr. [name]. He is on insulin and Trulicity. Last A1c was 10.3. He has not been taking his short acting insulin due to lower blood sugars (BGs 100-192). He denies hypoglycemia.  2. Vitamin D deficiency [name] is on Vit D, and he is tolerating it well.  3. At risk for osteoporosis [name] is at higher risk of osteopenia and osteoporosis due to Vitamin D deficiency.   Assessment/Plan:   1. Type 2 diabetes mellitus with other specified complication, with long-term current use of insulin (HCC) [name] will continue to follow up with Dr. [name]. [name] to decrease the likelihood of diabetic complications such as nephropathy, neuropathy, limb loss, blindness, coronary artery disease, and death. Intensive lifestyle modification including diet, exercise and weight loss are the first line of treatment for diabetes.   2. Vitamin D deficiency Low Vitamin D level contributes to fatigue and are associated with obesity, breast, and colon cancer. We will refill prescription Vitamin D for 1 month. [name] will follow-up for routine testing of Vitamin D, at least 2-3 times per year to avoid  over-replacement.  - Vitamin D, Ergocalciferol, (DRISDOL) 1.25 MG (50000 UNIT) CAPS capsule; Take 1 capsule (50,000 Units total) by mouth every 7 (seven) days.  Dispense: 4 capsule; Refill: 0  3. At risk for osteoporosis [name] was given approximately 15 minutes of osteoporosis prevention counseling today. [name] is at risk for osteopenia and osteoporosis due to his Vitamin D deficiency. He was encouraged to take his Vitamin D and follow his higher calcium diet and increase strengthening exercise to help strengthen his bones and decrease his risk of osteopenia and osteoporosis.  Repetitive spaced learning was employed today to elicit superior memory formation and behavioral change.  4. Class 2 severe obesity with serious comorbidity and body mass index (BMI) of 38.0 to 38.9 in adult, unspecified obesity type (HCC) [name] is currently in the action stage of change. As such, his goal is to continue with weight loss efforts. He has agreed to the Category 4 Plan.   Exercise goals: As is.  Behavioral modification strategies: meal planning and cooking strategies and keeping healthy foods in the home.  [name] has agreed to follow-up with our clinic in 3 weeks. He was informed of the importance of frequent follow-up visits to maximize his success with intensive lifestyle modifications for his multiple health conditions.   Objective:   Blood pressure 130/82, pulse 89, temperature 97.8 F (36.6 C), height 5\' 7"  (1.702 m), weight 229 lb (103.9 kg), SpO2 98 %. Body mass index is 35.87 kg/m.  General: Cooperative, alert, well developed, in no acute distress. HEENT: Conjunctivae and lids unremarkable. Cardiovascular: Regular rhythm.  Lungs: Normal work of breathing. Neurologic: No focal deficits.   Lab Results  Component Value Date   CREATININE 0.82 10/07/2020   BUN 15 10/07/2020   NA 142 10/07/2020   K 4.5 10/07/2020   CL 108 (H) 10/07/2020   CO2 19 (L) 10/07/2020   Lab Results  Component  Value Date   ALT 29 10/07/2020   AST 16 10/07/2020   ALKPHOS 102 10/07/2020   BILITOT <0.2 10/07/2020   Lab Results  Component Value Date   HGBA1C 10.3 (H) 10/07/2020   HGBA1C 11.8 (H) 03/22/2012   No results found for: INSULIN Lab Results  Component Value Date   TSH 4.280 10/07/2020   Lab Results  Component Value Date   CHOL 152 12/27/2019   HDL 31 (L) 12/27/2019   LDLCALC 63 12/27/2019   LDLDIRECT 85.2 10/04/2013   TRIG 372 (H) 12/27/2019   CHOLHDL 4.9 12/27/2019   Lab Results  Component Value Date   WBC 13.7 (H) 10/07/2020   HGB 15.7 10/07/2020   HCT 48.1 10/07/2020   MCV 88 10/07/2020   PLT 252 10/07/2020   No results found for: IRON, TIBC, FERRITIN  Attestation Statements:   Reviewed by clinician on day of visit: allergies, medications, problem list, medical history, surgical history, family history, social history, and previous encounter notes.   Trude Mcburney, am acting as transcriptionist for [employer], PA-C.  I have reviewed the above documentation for accuracy and completeness, and I agree with the above. -  *Alois Cliche, PA-C

## 2020-12-23 ENCOUNTER — Encounter: Payer: Self-pay | Admitting: Cardiovascular Disease

## 2020-12-23 NOTE — Progress Notes (Signed)
Fernando Nelson Date of Birth  09-09-1960       [facility]    [place] [address]  [address] [place], [place]  [address]   [place], [place]  [address] [phone]     [phone]   Fax  [phone]    Fax [phone]  Problem List: 1. Coronary Artery disease- s/p 3.5 EVOLVE II study stent by 20 mm to the RCA 2. Diabetes Mellitus 3. Hyperlipidemia / hypertriglyceridemia   Previous notes:   Fernando Nelson is a 60 yo with a recent episode of CP. He was admitted with unstable angina. He was found to have a tight right coronary artery stenosis and had placement of a 3.5 mm drug-eluting stent to the RCA.  He has had twinges of chest pain that last only several seconds.  He is exercising 4 times a week.  Feb. 13, 2015:  Fernando Nelson is doing very well. He is exercising without any chest pains or shortness breath. He is enrolled in the EVOLVE  II stent study.  He works as a Sport and exercise psychologist  For [employer]  Dr. [name] is following his lipid levels.  Nov. 22, 2016  Fernando Nelson is seen today for follow up of his CAD Doing well.  No angina  Exercising  -  Has some right sided pain , constant , not similar to his presenting pain  Will talk to his primary medical doctor  Dec. 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    No angina.   Exercising , gym 3 days a week .     Has lost some weight.   Dec 27, 2019:;  Still working at home . Trying to exercise  Wt is 230.   has gained 21 lbs this year  Has not done anything this past year due to covid.  21 members of his family have passed away due to Covid this past year No CP, no dyspnea  Dec 24, 2020: Fernando Nelson is seen today for follow  Up of his CAD  Hyperlipidemia, DM Wt is 236 lbs.,  Had gained 50 + lbs, now has lost that excess weight   Has improved his diet,  now is participating with the [facility] healthy weight loss program.  Still working with [employer]  Is exercising ,  No angina   Current Outpatient Medications on File Prior to Visit  Medication Sig Dispense Refill  . aspirin EC 81 MG tablet Take 81 mg by mouth every morning.    Marland Kitchen atorvastatin (LIPITOR) 80 MG tablet Take 1 tablet (80 mg total) by mouth daily. Please keep upcoming appt in May 2022 with Dr. Elease Hashimoto before anymore refills. Thank you 90 tablet 0  . insulin lispro (HUMALOG KWIKPEN) 200 UNIT/ML KwikPen Inject 20 Units into the skin daily. 20 to 36 units at meals, up to 60 units a day    . metoprolol tartrate (LOPRESSOR) 25 MG tablet TAKE 1 TABLET(25 MG) BY MOUTH TWICE DAILY. Please keep upcoming appt in May 2022 with Dr. Elease Hashimoto before anymore refills. Thank you 180 tablet 0  . Multiple Vitamin (MULTIVITAMIN WITH MINERALS) TABS Take 1 tablet by mouth daily.    . nitroGLYCERIN (NITROSTAT) 0.4 MG SL tablet Place 1 tablet (0.4 mg total) under the tongue every 5 (five) minutes x  3 doses as needed for chest pain. 25 tablet 6  . Testosterone 20.25 MG/1.25GM (1.62%) GEL Place onto the skin.    Nathen May SOLOSTAR 300 UNIT/ML Solostar Pen Inject 70 Units into the skin daily.    . valACYclovir (VALTREX) 500 MG tablet Take 500 mg by mouth 2 (two) times daily.    . Vitamin D, Ergocalciferol, (DRISDOL) 1.25 MG (50000 UNIT) CAPS capsule Take 1 capsule (50,000 Units total) by mouth every 7 (seven) days. 4 capsule 0   No current facility-administered medications on file prior to visit.    No Known Allergies  Past Medical History:  Diagnosis Date  . Always tired   . Back pain   . Bilateral swelling of feet and ankles   . CAD (coronary artery disease)    a. [place] 02/2012 - s/p PTCA/Evolve II study stent to RCA 03/23/12, residual LAD dz  . Chest pain   . Erectile dysfunction   . Hearing loss   . Hepatitis B ~ 2000   "donated blood to [employer]; got letter; had shots over 6 months; gone now"  .  Hyperlipemia   . Hypertension   . Shortness of breath   . Swallowing difficulty   . Type II diabetes mellitus (HCC)     Past Surgical History:  Procedure Laterality Date  . CORONARY ANGIOPLASTY WITH STENT PLACEMENT  03/23/12   "1; first one ever"  . LEFT HEART CATHETERIZATION WITH CORONARY ANGIOGRAM N/A 03/23/2012   Procedure: LEFT HEART CATHETERIZATION WITH CORONARY ANGIOGRAM;  Surgeon: Dolores Patty, MD;  Location: [facility] CATH LAB;  Service: Cardiovascular;  Laterality: N/A;  . PERCUTANEOUS CORONARY STENT INTERVENTION (PCI-S)  03/23/2012   Procedure: PERCUTANEOUS CORONARY STENT INTERVENTION (PCI-S);  Surgeon: Dolores Patty, MD;  Location: [facility] C[facility] LAB;  Service: Cardiovascular;;  . SHOULDER OPEN ROTATOR CUFF REPAIR  2005   left  . VASECTOMY      Social History   Tobacco Use  Smoking Status Former Smoker  . Packs/day: 1.00  . Years: 25.00  . Pack years: 25.00  . Types: Cigarettes  . Quit date: 09/22/2006  . Years since quitting: 14.2  Smokeless Tobacco Never Used    Social History   Substance and Sexual Activity  Alcohol Use No    Family History  Problem Relation Age of Onset  . Heart failure Mother   . Diabetes Mother   . Heart disease Mother   . Sudden death Mother   . Obesity Mother   . Hypertension Father   . Hyperlipidemia Father   . Heart disease Father   . Sudden death Father   . Cancer Father   . Obesity Father     Reviw of Systems:  Reviewed in the HPI.  All other systems are negative.  Physical Exam: Blood pressure 122/68, pulse 68, height 5\' 7"  (1.702 m), weight 236 lb 12.8 oz (107.4 kg), SpO2 95 %.  GEN:  Moderately obese male,  NAD  HEENT: Normal NECK: No JVD; No carotid bruits LYMPHATICS: No lymphadenopathy CARDIAC: RRR , no murmurs, rubs, gallops RESPIRATORY:  Clear to auscultation without rales, wheezing or rhonchi  ABDOMEN: Soft, non-tender, non-distended MUSCULOSKELETAL:  No edema; No deformity  SKIN: Warm and dry NEUROLOGIC:  Alert  and oriented x 3  ECG:   Assessment / Plan:   1. Coronary Artery disease- s/p 3.5 EVOLVE II study stent by 20 mm to the RCA-  No angina ,  Cont current meds.   . Encouraged continued weight loss .  2. Diabetes Mellitus-     3. Hyperlipidemia / hypertriglyceridemia - check lipids , ALT, BMP today .    Kristeen Miss, MD  12/24/2020 10:18 AM    [facility] [address] [place],  [address] [place], [place]  [address] Pager [phone] Phone: [phone]; Fax: [phone]

## 2020-12-24 ENCOUNTER — Ambulatory Visit: Payer: Commercial Managed Care - PPO | Admitting: Cardiovascular Disease

## 2020-12-24 ENCOUNTER — Encounter: Payer: Self-pay | Admitting: Cardiovascular Disease

## 2020-12-24 ENCOUNTER — Other Ambulatory Visit: Payer: Self-pay

## 2020-12-24 VITALS — BP 122/68 | HR 68 | Ht 67.0 in | Wt 236.8 lb

## 2020-12-24 DIAGNOSIS — E782 Mixed hyperlipidemia: Secondary | ICD-10-CM | POA: Diagnosis not present

## 2020-12-24 DIAGNOSIS — I2510 Atherosclerotic heart disease of native coronary artery without angina pectoris: Secondary | ICD-10-CM

## 2020-12-24 LAB — BASIC METABOLIC PANEL
BUN/Creatinine Ratio: 21 — ABNORMAL HIGH (ref 9–20)
BUN: 16 mg/dL (ref 6–24)
CO2: 24 mmol/L (ref 20–29)
Calcium: 9 mg/dL (ref 8.7–10.2)
Chloride: 101 mmol/L (ref 96–106)
Creatinine, Ser: 0.78 mg/dL (ref 0.76–1.27)
Glucose: 133 mg/dL — ABNORMAL HIGH (ref 65–99)
Potassium: 4.5 mmol/L (ref 3.5–5.2)
Sodium: 139 mmol/L (ref 134–144)
eGFR: 103 mL/min/{1.73_m2} (ref >59)

## 2020-12-24 LAB — LIPID PANEL
Chol/HDL Ratio: 4.5 ratio (ref 0.0–5.0)
Cholesterol, Total: 127 mg/dL (ref 100–199)
HDL: 28 mg/dL — ABNORMAL LOW (ref >39)
LDL Chol Calc (NIH): 56 mg/dL (ref 0–99)
Triglycerides: 272 mg/dL — ABNORMAL HIGH (ref 0–149)
VLDL Cholesterol Cal: 43 mg/dL — ABNORMAL HIGH (ref 5–40)

## 2020-12-24 LAB — ALT: ALT: 21 IU/L (ref 0–44)

## 2020-12-24 MED ORDER — OMEGA-3-ACID ETHYL ESTERS 1 G PO CAPS: 1.0000 g | ORAL_CAPSULE | Freq: Every day | ORAL | 3 refills | Status: DC

## 2020-12-24 NOTE — Patient Instructions (Signed)
Medication Instructions:  Your physician recommends that you continue on your current medications as directed. Please refer to the Current Medication list given to you today.  *If you need a refill on your cardiac medications before your next appointment, please call your pharmacy*   Lab Work: TODAY:Lipids,alt, bmet If you have labs (blood work) drawn today and your tests are completely normal, you will receive your results only by: . MyChart Message (if you have MyChart) OR . A paper copy in the mail If you have any lab test that is abnormal or we need to change your treatment, we will call you to review the results.   Testing/Procedures: none   Follow-Up: At [facility], you and your health needs are our priority.  As part of our continuing mission to provide you with exceptional heart care, we have created designated Provider Care Teams.  These Care Teams include your primary Cardiologist (physician) and Advanced Practice Providers (APPs -  Physician Assistants and Nurse Practitioners) who all work together to provide you with the care you need, when you need it.  Your next appointment:   1 year(s)  The format for your next appointment:   In Person  Provider:   You may see Philip Nahser, MD or one of the following Advanced Practice Providers on your designated Care Team:    Scott Weaver, PA-C  Vin Bhagat, PA-C      

## 2021-01-07 ENCOUNTER — Other Ambulatory Visit: Payer: Self-pay

## 2021-01-07 ENCOUNTER — Other Ambulatory Visit (INDEPENDENT_AMBULATORY_CARE_PROVIDER_SITE_OTHER): Payer: Self-pay | Admitting: Physician Assistant

## 2021-01-07 ENCOUNTER — Ambulatory Visit (INDEPENDENT_AMBULATORY_CARE_PROVIDER_SITE_OTHER): Payer: Commercial Managed Care - PPO | Admitting: Physician Assistant

## 2021-01-07 ENCOUNTER — Encounter (INDEPENDENT_AMBULATORY_CARE_PROVIDER_SITE_OTHER): Payer: Self-pay | Admitting: Physician Assistant

## 2021-01-07 VITALS — BP 123/73 | HR 85 | Temp 97.4°F | Ht 67.0 in | Wt 228.0 lb

## 2021-01-07 DIAGNOSIS — Z794 Long term (current) use of insulin: Secondary | ICD-10-CM

## 2021-01-07 DIAGNOSIS — Z9189 Other specified personal risk factors, not elsewhere classified: Secondary | ICD-10-CM

## 2021-01-07 DIAGNOSIS — E559 Vitamin D deficiency, unspecified: Secondary | ICD-10-CM | POA: Diagnosis not present

## 2021-01-07 DIAGNOSIS — E1169 Type 2 diabetes mellitus with other specified complication: Secondary | ICD-10-CM | POA: Diagnosis not present

## 2021-01-07 DIAGNOSIS — E785 Hyperlipidemia, unspecified: Secondary | ICD-10-CM

## 2021-01-07 DIAGNOSIS — Z6838 Body mass index (BMI) 38.0-38.9, adult: Secondary | ICD-10-CM

## 2021-01-07 MED ORDER — VITAMIN D (ERGOCALCIFEROL) 1.25 MG (50000 UNIT) PO CAPS: 50000.0000 [IU] | ORAL_CAPSULE | ORAL | 0 refills | Status: DC

## 2021-01-07 NOTE — Progress Notes (Signed)
Chief Complaint:   OBESITY Fernando Nelson is here to discuss his progress with his obesity treatment plan along with follow-up of his obesity related diagnoses. Fernando Nelson is on the Category 4 Plan and states he is following his eating plan approximately 100% of the time. Fernando Nelson states he is walking for 120 minutes 7 times per week.  Today's visit was #: 6 Starting weight: 244 lbs Starting date: 10/07/2020 Today's weight: 228 lbs Today's date: 01/07/2021 Total lbs lost to date: 16 Total lbs lost since last in-office visit: 1  Interim History: Fernando Nelson did well with weight loss. He states that he has had more red meat, indulging in [place] strips, and also ribs recently. His hunger is controlled.  Subjective:   1. Type 2 diabetes mellitus with other specified complication, with long-term current use of insulin (HCC) Fernando Nelson is followed by Dr. [name]. He is on Trulicity and insulin. Last A1c was 10.3.  2. Hyperlipidemia associated with type 2 diabetes mellitus (HCC) Fernando Nelson sees Dr. Elease Hashimoto, Cardiologist. His recent labs show lipid panel not at goal.  3. Vitamin D deficiency Fernando Nelson is on Vit D weekly, and he is tolerating it well. He is due for labs.  4. At risk for hypoglycemia Fernando Nelson is at increased risk for hypoglycemia due to changes in diet, diagnosis of diabetes, and/or insulin use.    Assessment/Plan:   1. Type 2 diabetes mellitus with other specified complication, with long-term current use of insulin (HCC) We will check labs today. [name] is to follow up with Dr. [name] next month. [name] to decrease the likelihood of diabetic complications such as nephropathy, neuropathy, limb loss, blindness, coronary artery disease, and death. Intensive lifestyle modification including diet, exercise and weight loss are the first line of treatment for diabetes.   - Hemoglobin A1c - Comprehensive metabolic panel  2. Hyperlipidemia associated with type 2 diabetes mellitus  (HCC) Cardiovascular risk and specific lipid/LDL goals reviewed. We discussed several lifestyle modifications today. [name] will follow up with Dr. Elease Hashimoto, and he will continue to work on diet, exercise and weight loss efforts. Orders and follow up as documented in patient record.   Counseling Intensive lifestyle modifications are the first line treatment for this issue. . Dietary changes: Increase soluble fiber. Decrease simple carbohydrates. . Exercise changes: Moderate to vigorous-intensity aerobic activity 150 minutes per week if tolerated. . Lipid-lowering medications: see documented in medical record.  3. Vitamin D deficiency Low Vitamin D level contributes to fatigue and are associated with obesity, breast, and colon cancer. We will check labs today, and we will refill prescription Vitamin D for 1 month. Haylen will follow-up for routine testing of Vitamin D, at least 2-3 times per year to avoid over-replacement.  - Vitamin D, Ergocalciferol, (DRISDOL) 1.25 MG (50000 UNIT) CAPS capsule; Take 1 capsule (50,000 Units total) by mouth every 7 (seven) days.  Dispense: 4 capsule; Refill: 0 - VITAMIN D 25 Hydroxy (Vit-D Deficiency, Fractures)  4. At risk for hypoglycemia Fernando Nelson was given approximately 15 minutes of counseling today regarding prevention of hypoglycemia. He was advised of symptoms of hypoglycemia. Fernando Nelson was instructed to avoid skipping meals, eat regular protein rich meals and schedule low calorie snacks as needed.   Repetitive spaced learning was employed today to elicit superior memory formation and behavioral change  5. Class 2 severe obesity with serious comorbidity and body mass index (BMI) of 38.0 to 38.9 in adult, unspecified obesity type (HCC) Fernando Nelson is currently in the action stage  of change. As such, his goal is to continue with weight loss efforts. He has agreed to the Category 4 Plan.   Exercise goals: As is.  Behavioral modification strategies: meal planning and  cooking strategies and keeping healthy foods in the home.  [name] has agreed to follow-up with our clinic in 3 weeks. He was informed of the importance of frequent follow-up visits to maximize his success with intensive lifestyle modifications for his multiple health conditions.   Fernando Nelson was informed we would discuss his lab results at his next visit unless there is a critical issue that needs to be addressed sooner. Fernando Nelson agreed to keep his next visit at the agreed upon time to discuss these results.  Objective:   Blood pressure 123/73, pulse 85, temperature (!) 97.4 F (36.3 C), temperature source Oral, height 5\' 7"  (1.702 m), weight 228 lb (103.4 kg), SpO2 94 %. Body mass index is 35.71 kg/m.  General: Cooperative, alert, well developed, in no acute distress. HEENT: Conjunctivae and lids unremarkable. Cardiovascular: Regular rhythm.  Lungs: Normal work of breathing. Neurologic: No focal deficits.   Lab Results  Component Value Date   CREATININE 0.78 12/24/2020   BUN 16 12/24/2020   NA 139 12/24/2020   K 4.5 12/24/2020   CL 101 12/24/2020   CO2 24 12/24/2020   Lab Results  Component Value Date   ALT 21 12/24/2020   AST 16 10/07/2020   ALKPHOS 102 10/07/2020   BILITOT <0.2 10/07/2020   Lab Results  Component Value Date   HGBA1C 10.3 (H) 10/07/2020   HGBA1C 11.8 (H) 03/22/2012   No results found for: INSULIN Lab Results  Component Value Date   TSH 4.280 10/07/2020   Lab Results  Component Value Date   CHOL 127 12/24/2020   HDL 28 (L) 12/24/2020   LDLCALC 56 12/24/2020   LDLDIRECT 85.2 10/04/2013   TRIG 272 (H) 12/24/2020   CHOLHDL 4.5 12/24/2020   Lab Results  Component Value Date   WBC 13.7 (H) 10/07/2020   HGB 15.7 10/07/2020   HCT 48.1 10/07/2020   MCV 88 10/07/2020   PLT 252 10/07/2020   No results found for: IRON, TIBC, FERRITIN  Attestation Statements:   Reviewed by clinician on day of visit: allergies, medications, problem list, medical  history, surgical history, family history, social history, and previous encounter notes.   10/09/2020, am acting as transcriptionist for Trude Mcburney, PA-C.  I have reviewed the above documentation for accuracy and completeness, and I agree with the above. [employer], PA-C

## 2021-01-08 LAB — COMPREHENSIVE METABOLIC PANEL
ALT: 26 IU/L (ref 0–44)
AST: 12 IU/L (ref 0–40)
Albumin/Globulin Ratio: 1.4 (ref 1.2–2.2)
Albumin: 4.1 g/dL (ref 3.8–4.9)
Alkaline Phosphatase: 92 IU/L (ref 44–121)
BUN/Creatinine Ratio: 24 — ABNORMAL HIGH (ref 9–20)
BUN: 21 mg/dL (ref 6–24)
Bilirubin Total: <0.2 mg/dL (ref 0.0–1.2)
CO2: 22 mmol/L (ref 20–29)
Calcium: 9.1 mg/dL (ref 8.7–10.2)
Chloride: 102 mmol/L (ref 96–106)
Creatinine, Ser: 0.89 mg/dL (ref 0.76–1.27)
Globulin, Total: 3 g/dL (ref 1.5–4.5)
Glucose: 196 mg/dL — ABNORMAL HIGH (ref 65–99)
Potassium: 4.8 mmol/L (ref 3.5–5.2)
Sodium: 141 mmol/L (ref 134–144)
Total Protein: 7.1 g/dL (ref 6.0–8.5)
eGFR: 99 mL/min/{1.73_m2} (ref >59)

## 2021-01-08 LAB — HEMOGLOBIN A1C
Est. average glucose Bld gHb Est-mCnc: 220 mg/dL
Hgb A1c MFr Bld: 9.3 % — ABNORMAL HIGH (ref 4.8–5.6)

## 2021-01-08 LAB — VITAMIN D 25 HYDROXY (VIT D DEFICIENCY, FRACTURES): Vit D, 25-Hydroxy: 40.3 ng/mL (ref 30.0–100.0)

## 2021-01-28 ENCOUNTER — Ambulatory Visit (INDEPENDENT_AMBULATORY_CARE_PROVIDER_SITE_OTHER): Payer: Commercial Managed Care - PPO | Admitting: Physician Assistant

## 2021-02-07 ENCOUNTER — Other Ambulatory Visit (INDEPENDENT_AMBULATORY_CARE_PROVIDER_SITE_OTHER): Payer: Self-pay | Admitting: Physician Assistant

## 2021-02-07 DIAGNOSIS — E559 Vitamin D deficiency, unspecified: Secondary | ICD-10-CM

## 2021-02-08 NOTE — Telephone Encounter (Addendum)
Last seen Tracey Refill request

## 2021-02-09 ENCOUNTER — Other Ambulatory Visit (INDEPENDENT_AMBULATORY_CARE_PROVIDER_SITE_OTHER): Payer: Self-pay | Admitting: Family Medicine

## 2021-02-09 ENCOUNTER — Other Ambulatory Visit: Payer: Self-pay | Admitting: Cardiovascular Disease

## 2021-02-09 DIAGNOSIS — E559 Vitamin D deficiency, unspecified: Secondary | ICD-10-CM

## 2021-02-24 ENCOUNTER — Ambulatory Visit (INDEPENDENT_AMBULATORY_CARE_PROVIDER_SITE_OTHER): Payer: Commercial Managed Care - PPO | Admitting: Physician Assistant

## 2021-03-08 ENCOUNTER — Other Ambulatory Visit (INDEPENDENT_AMBULATORY_CARE_PROVIDER_SITE_OTHER): Payer: Self-pay | Admitting: Family Medicine

## 2021-03-08 DIAGNOSIS — E559 Vitamin D deficiency, unspecified: Secondary | ICD-10-CM

## 2021-03-08 NOTE — Telephone Encounter (Signed)
Last seen Fernando Nelson 

## 2021-03-19 ENCOUNTER — Other Ambulatory Visit: Payer: Self-pay

## 2021-03-19 MED ORDER — ATORVASTATIN CALCIUM 80 MG PO TABS: 80.0000 mg | ORAL_TABLET | Freq: Every day | ORAL | 3 refills | Status: AC

## 2021-05-14 DIAGNOSIS — L03116 Cellulitis of left lower limb: Secondary | ICD-10-CM | POA: Diagnosis not present

## 2021-05-18 ENCOUNTER — Telehealth: Payer: Self-pay

## 2021-05-18 NOTE — Telephone Encounter (Signed)
**Note De-Identified Isela Stantz Obfuscation** I started a Omega 3 ethyl esters PA through covermymeds. Key: [id number]

## 2021-05-28 NOTE — Telephone Encounter (Signed)
**Note De-Identified Daviyon Widmayer Obfuscation** Asa Lente Key: [id number] - PA Case ID: [id number] - Rx #: [id number] Outcome: Approved on September 27 Type:Prior Auth;Coverage Start Date:04/18/2021;Coverage End Date:05/18/2022; Drug: Omega-3-acid Ethyl Esters 1GM capsules Form: [employer] Electronic PA Form   I have notified [facility] of this approval.

## 2021-05-31 DIAGNOSIS — L309 Dermatitis, unspecified: Secondary | ICD-10-CM | POA: Diagnosis not present

## 2021-06-08 DIAGNOSIS — L218 Other seborrheic dermatitis: Secondary | ICD-10-CM | POA: Diagnosis not present

## 2021-09-01 DIAGNOSIS — R059 Cough, unspecified: Secondary | ICD-10-CM | POA: Diagnosis not present

## 2021-09-01 DIAGNOSIS — J40 Bronchitis, not specified as acute or chronic: Secondary | ICD-10-CM | POA: Diagnosis not present

## 2021-09-01 DIAGNOSIS — J019 Acute sinusitis, unspecified: Secondary | ICD-10-CM | POA: Diagnosis not present

## 2021-11-23 DIAGNOSIS — E1142 Type 2 diabetes mellitus with diabetic polyneuropathy: Secondary | ICD-10-CM | POA: Diagnosis not present

## 2021-11-23 DIAGNOSIS — N529 Male erectile dysfunction, unspecified: Secondary | ICD-10-CM | POA: Diagnosis not present

## 2021-11-23 DIAGNOSIS — Z125 Encounter for screening for malignant neoplasm of prostate: Secondary | ICD-10-CM | POA: Diagnosis not present

## 2021-11-23 DIAGNOSIS — L308 Other specified dermatitis: Secondary | ICD-10-CM | POA: Diagnosis not present

## 2021-11-23 DIAGNOSIS — I2510 Atherosclerotic heart disease of native coronary artery without angina pectoris: Secondary | ICD-10-CM | POA: Diagnosis not present

## 2021-11-23 DIAGNOSIS — E782 Mixed hyperlipidemia: Secondary | ICD-10-CM | POA: Diagnosis not present

## 2021-11-23 DIAGNOSIS — Z0000 Encounter for general adult medical examination without abnormal findings: Secondary | ICD-10-CM | POA: Diagnosis not present

## 2021-12-09 DIAGNOSIS — Z794 Long term (current) use of insulin: Secondary | ICD-10-CM | POA: Diagnosis not present

## 2021-12-09 DIAGNOSIS — E781 Pure hyperglyceridemia: Secondary | ICD-10-CM | POA: Diagnosis not present

## 2021-12-09 DIAGNOSIS — E669 Obesity, unspecified: Secondary | ICD-10-CM | POA: Diagnosis not present

## 2021-12-09 DIAGNOSIS — E1165 Type 2 diabetes mellitus with hyperglycemia: Secondary | ICD-10-CM | POA: Diagnosis not present

## 2021-12-21 ENCOUNTER — Ambulatory Visit: Payer: Commercial Managed Care - PPO | Admitting: Cardiovascular Disease

## 2021-12-28 ENCOUNTER — Other Ambulatory Visit: Payer: Self-pay | Admitting: *Deleted

## 2021-12-28 ENCOUNTER — Encounter: Payer: Self-pay | Admitting: *Deleted

## 2021-12-28 MED ORDER — OMEGA-3-ACID ETHYL ESTERS 1 G PO CAPS: 1.0000 g | ORAL_CAPSULE | Freq: Every day | ORAL | 0 refills | Status: DC

## 2022-01-05 DIAGNOSIS — J029 Acute pharyngitis, unspecified: Secondary | ICD-10-CM | POA: Diagnosis not present

## 2022-01-05 DIAGNOSIS — J039 Acute tonsillitis, unspecified: Secondary | ICD-10-CM | POA: Diagnosis not present

## 2022-01-05 DIAGNOSIS — Z03818 Encounter for observation for suspected exposure to other biological agents ruled out: Secondary | ICD-10-CM | POA: Diagnosis not present

## 2022-01-05 DIAGNOSIS — R051 Acute cough: Secondary | ICD-10-CM | POA: Diagnosis not present

## 2022-01-05 DIAGNOSIS — R509 Fever, unspecified: Secondary | ICD-10-CM | POA: Diagnosis not present

## 2022-01-05 DIAGNOSIS — R5383 Other fatigue: Secondary | ICD-10-CM | POA: Diagnosis not present

## 2022-01-30 ENCOUNTER — Encounter: Payer: Self-pay | Admitting: Cardiovascular Disease

## 2022-01-30 NOTE — Progress Notes (Unsigned)
Fernando Nelson Date of Birth  Dec 30, 1960       [facility] [address], [place], [place]  [address]   [place], [place]  [address] [phone]     [phone]   Fax  [phone]    Fax [phone]  Problem List: 1. Coronary Artery disease- s/p 3.5 EVOLVE II study stent by 20 mm to the RCA 2. Diabetes Mellitus 3. Hyperlipidemia / hypertriglyceridemia   Previous notes:   Fernando Nelson is a 61 yo with a recent episode of CP. He was admitted with unstable angina. He was found to have a tight right coronary artery stenosis and had placement of a 3.5 mm drug-eluting stent to the RCA.  He has had twinges of chest pain that last only several seconds.  He is exercising 4 times a week.  Feb. 13, 2015:  Fernando Nelson is doing very well. He is exercising without any chest pains or shortness breath. He is enrolled in the EVOLVE  II stent study.  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  Dr. Buddy Duty is following his lipid levels.  Nov. 22, 2016  Fernando Nelson is seen today for follow up of his CAD Doing well.  No angina  Exercising  -  Has some right sided pain , constant , not similar to his presenting pain  Will talk to his primary medical doctor  Dec. 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    No angina.   Exercising , gym 3 days a week .     Has lost some weight.   Dec 27, 2019:;  Still working at home . Trying to exercise  Wt is 230.   has gained 21 lbs this year  Has not done anything this past year due to covid.  21 members of his family have passed away due to Covid this past year No CP, no dyspnea  Dec 24, 2020: Fernando Nelson is seen today for follow  Up of his CAD  Hyperlipidemia, DM Wt is 236 lbs.,  Had gained 68 + lbs, now has lost that excess weight   Has improved his diet,  now is participating with the [facility] healthy weight loss program.  Still working with [employer]  Is exercising ,  No angina   January 31, 2022 Fernando Nelson is seen for follow up of his CAD, HLD, DM Wt is 229 lbs.  Has lost 7 lbs from last year  Has occasional / rare chest twinges - last for 10 seconds  Works out 5 times a week. Is watching her diet   Labs done on November 23, 2021 at his primary medical doctor's office reveals a total cholesterol of 130.  HDL is 30.  LDL is 60.  Triglyceride level is 244.  Creatinine 0.85.  Potassium is 4.6.  Current Outpatient Medications on File Prior to Visit  Medication Sig Dispense Refill   aspirin EC 81 MG tablet Take 81 mg by mouth every morning.     atorvastatin (LIPITOR) 80 MG tablet Take 1 tablet (80 mg total) by mouth daily. 90 tablet 3   insulin lispro (HUMALOG KWIKPEN) 200 UNIT/ML KwikPen Inject 20 Units into the skin daily. 20 to 36 units at meals, up to 60 units a  day     metoprolol tartrate (LOPRESSOR) 25 MG tablet TAKE 1 TABLET BY MOUTH TWICE DAILY. 180 tablet 3   Multiple Vitamin (MULTIVITAMIN WITH MINERALS) TABS Take 1 tablet by mouth daily.     omega-3 acid ethyl esters (LOVAZA) 1 g capsule Take 1 capsule (1 g total) by mouth daily. 30 capsule 0   Testosterone 20.25 MG/1.25GM (1.62%) GEL Place onto the skin.     TOUJEO SOLOSTAR 300 UNIT/ML Solostar Pen Inject 70 Units into the skin daily.     valACYclovir (VALTREX) 500 MG tablet Take 500 mg by mouth 2 (two) times daily.     Vitamin D, Ergocalciferol, (DRISDOL) 1.25 MG (50000 UNIT) CAPS capsule TAKE 1 CAPSULE BY MOUTH EVERY 7 DAYS 4 capsule 0   nitroGLYCERIN (NITROSTAT) 0.4 MG SL tablet Place 1 tablet (0.4 mg total) under the tongue every 5 (five) minutes x 3 doses as needed for chest pain. (Patient not taking: Reported on 01/31/2022) 25 tablet 6   No current facility-administered medications on file prior to visit.    No Known Allergies  Past Medical History:  Diagnosis Date   Always tired     Back pain    Bilateral swelling of feet and ankles    CAD (coronary artery disease)    a. [place] 02/2012 - s/p PTCA/Evolve II study stent to RCA 03/23/12, residual LAD dz   Chest pain    Erectile dysfunction    Hearing loss    Hepatitis B ~ 2000   "donated blood to [employer]; got letter; had shots over 6 months; gone now"   Hyperlipemia    Hypertension    Shortness of breath    Swallowing difficulty    Type II diabetes mellitus ([place])     Past Surgical History:  Procedure Laterality Date   CORONARY ANGIOPLASTY WITH STENT PLACEMENT  03/23/12   "1; first one ever"   LEFT HEART CATHETERIZATION WITH CORONARY ANGIOGRAM N/A 03/23/2012   Procedure: LEFT HEART CATHETERIZATION WITH CORONARY ANGIOGRAM;  Surgeon: Jolaine Artist, MD;  Location: [facility] CATH LAB;  Service: Cardiovascular;  Laterality: N/A;   PERCUTANEOUS CORONARY STENT INTERVENTION (PCI-S)  03/23/2012   Procedure: PERCUTANEOUS CORONARY STENT INTERVENTION (PCI-S);  Surgeon: Jolaine Artist, MD;  Location: [facility] CATH LAB;  Service: Cardiovascular;;   SHOULDER OPEN ROTATOR CUFF REPAIR  2005   left   VASECTOMY      Social History   Tobacco Use  Smoking Status Former   Packs/day: 1.00   Years: 25.00   Total pack years: 25.00   Types: Cigarettes   Quit date: 09/22/2006   Years since quitting: 15.3  Smokeless Tobacco Never    Social History   Substance and Sexual Activity  Alcohol Use No    Family History  Problem Relation Age of Onset   Heart failure Mother    Diabetes Mother    Heart disease Mother    Sudden death Mother    Obesity Mother    Hypertension Father    Hyperlipidemia Father    Heart disease Father    Sudden death Father    Cancer Father    Obesity Father     Reviw of Systems:  Reviewed in the HPI.  All other systems are negative.  Physical Exam: Blood pressure 138/80, pulse 91, height 5\' 7"  (1.702 m), weight 229 lb 3.2 oz (104 kg), SpO2 98 %.  GEN:  Well nourished, well developed in no acute  distress HEENT: Normal NECK: No JVD; No carotid  bruits LYMPHATICS: No lymphadenopathy CARDIAC: RRR , no murmurs, rubs, gallops RESPIRATORY:  Clear to auscultation without rales, wheezing or rhonchi  ABDOMEN: Soft, non-tender, non-distended MUSCULOSKELETAL:  No edema; No deformity  SKIN: Warm and dry NEUROLOGIC:  Alert and oriented x 3   ECG:  June, 12, 2023;  NSR at 91.  Normal ECG    Assessment / Plan:   1. Coronary Artery disease- s/p 3.5 EVOLVE II study stent by 20 mm to the RCA-  No angina  .    2. Diabetes Mellitus-     3. Hyperlipidemia / hypertriglyceridemia -  Lipids look ok.  Trigs are mildly elevated.  Cont diet, exercise, weight loss efforts.     Mertie Moores, MD  01/31/2022 3:55 PM    [address],  [place], [place]  [address] Pager [phone] Phone: [phone]; Fax: [phone]

## 2022-01-31 ENCOUNTER — Encounter: Payer: Self-pay | Admitting: Cardiovascular Disease

## 2022-01-31 ENCOUNTER — Ambulatory Visit: Payer: BC Managed Care – PPO | Admitting: Cardiovascular Disease

## 2022-01-31 VITALS — BP 138/80 | HR 91 | Ht 67.0 in | Wt 229.2 lb

## 2022-01-31 DIAGNOSIS — E1169 Type 2 diabetes mellitus with other specified complication: Secondary | ICD-10-CM | POA: Diagnosis not present

## 2022-01-31 DIAGNOSIS — I25119 Atherosclerotic heart disease of native coronary artery with unspecified angina pectoris: Secondary | ICD-10-CM | POA: Diagnosis not present

## 2022-01-31 DIAGNOSIS — E782 Mixed hyperlipidemia: Secondary | ICD-10-CM

## 2022-01-31 DIAGNOSIS — Z794 Long term (current) use of insulin: Secondary | ICD-10-CM | POA: Diagnosis not present

## 2022-01-31 NOTE — Patient Instructions (Signed)
Medication Instructions:  Your physician recommends that you continue on your current medications as directed. Please refer to the Current Medication list given to you today.  *If you need a refill on your cardiac medications before your next appointment, please call your pharmacy*   Lab Work: NONE If you have labs (blood work) drawn today and your tests are completely normal, you will receive your results only by: MyChart Message (if you have MyChart) OR A paper copy in the mail If you have any lab test that is abnormal or we need to change your treatment, we will call you to review the results.   Testing/Procedures: NONE   Follow-Up: At [facility], you and your health needs are our priority.  As part of our continuing mission to provide you with exceptional heart care, we have created designated Provider Care Teams.  These Care Teams include your primary Cardiologist (physician) and Advanced Practice Providers (APPs -  Physician Assistants and Nurse Practitioners) who all work together to provide you with the care you need, when you need it.  Your next appointment:   1 year(s)  The format for your next appointment:   In Person  Provider:   Tereso Newcomer, PA {     Important Information About Sugar

## 2022-02-10 DIAGNOSIS — R58 Hemorrhage, not elsewhere classified: Secondary | ICD-10-CM | POA: Diagnosis not present

## 2022-02-14 ENCOUNTER — Other Ambulatory Visit: Payer: Self-pay

## 2022-02-14 DIAGNOSIS — R0683 Snoring: Secondary | ICD-10-CM | POA: Diagnosis not present

## 2022-02-14 DIAGNOSIS — R1310 Dysphagia, unspecified: Secondary | ICD-10-CM | POA: Diagnosis not present

## 2022-02-14 DIAGNOSIS — R053 Chronic cough: Secondary | ICD-10-CM | POA: Diagnosis not present

## 2022-02-14 DIAGNOSIS — K219 Gastro-esophageal reflux disease without esophagitis: Secondary | ICD-10-CM | POA: Diagnosis not present

## 2022-02-14 MED ORDER — OMEGA-3-ACID ETHYL ESTERS 1 G PO CAPS: 1.0000 g | ORAL_CAPSULE | Freq: Every day | ORAL | 3 refills | Status: DC

## 2022-02-15 ENCOUNTER — Encounter: Payer: Self-pay | Admitting: Cardiovascular Disease

## 2022-02-17 ENCOUNTER — Other Ambulatory Visit: Payer: Self-pay | Admitting: *Deleted

## 2022-02-17 MED ORDER — METOPROLOL TARTRATE 25 MG PO TABS: ORAL_TABLET | ORAL | 3 refills | Status: DC

## 2022-03-01 ENCOUNTER — Other Ambulatory Visit: Payer: Self-pay | Admitting: Cardiovascular Disease

## 2022-03-21 DIAGNOSIS — E1165 Type 2 diabetes mellitus with hyperglycemia: Secondary | ICD-10-CM | POA: Diagnosis not present

## 2022-03-21 DIAGNOSIS — Z794 Long term (current) use of insulin: Secondary | ICD-10-CM | POA: Diagnosis not present

## 2022-03-21 DIAGNOSIS — E781 Pure hyperglyceridemia: Secondary | ICD-10-CM | POA: Diagnosis not present

## 2022-03-21 DIAGNOSIS — E669 Obesity, unspecified: Secondary | ICD-10-CM | POA: Diagnosis not present

## 2022-03-29 DIAGNOSIS — R202 Paresthesia of skin: Secondary | ICD-10-CM | POA: Diagnosis not present

## 2022-03-30 ENCOUNTER — Encounter (INDEPENDENT_AMBULATORY_CARE_PROVIDER_SITE_OTHER): Payer: Self-pay

## 2022-05-26 MED ORDER — ICOSAPENT ETHYL 1 G PO CAPS: 2.0000 g | ORAL_CAPSULE | Freq: Two times a day (BID) | ORAL | 3 refills | Status: DC

## 2022-05-28 NOTE — Progress Notes (Signed)
Synopsis: Referred in October 2023 for chronic cough.  He had previously been evaluated by Dr. Marene Lenz with otolaryngology.  A laryngoscopy demonstrated findings of edema and inflammation of the arytenoids and vocal cords.  The patient was started on twice daily proton pump inhibitor treatment but apparently reported no improvement in symptoms.  The patient was also referred for a sleep study which was not performed.  History of CAD had stent around 2003.    Subjective:   PATIENT ID: Fernando Nelson GENDER: male DOB: 02/05/1961, MRN: [id number]   HPI  Chief Complaint  Patient presents with   Consult    Cough and sore throat for past yr    [name] says that he has had sore throat and cough for a year.  He's also got shortness of breath with exertion.  He feels that the cough and shortness of breath are worsening.    Cough: > dry  > typically worse in the mornings > no clear environmental   Dyspnea: > strictly with exertion > when he is outside  > he has 6 acres of land to keep up > when he is weed eating, pulling weeds he feels short of breath  Weight: > he says it has been "terrible" > he has gained 20 pounds in the last few montsh > insulin dosing changed  Cigarette smoker: > smoked 1ppd for 30 years > quit in 2005  He had a cardiology evaluation in May and was told that things were OK.    He says that he has been sen by ENT and has "passed all the tests".  In the last month he has developed a new excoriating rash. > it itches > it's all over his body > he is supposed to see dermatology soon  He isn't coughing anything up.  No prior lung problems, no childhood respiratory illnesses.  Ran track in [place] and holds the [place] record for the 440.   He works from home for the last 15 years.  No changes in his home, no new pets, mold.  He remodeled the kitchen a year ago.    Hobbies include bowling and keeping his grandkids.  He stays active with exercise,  he walks regularly, goes to a gym regularly ([facility] center off [address]).  There are trainers there.  When he goes he does strength training and treadmill work three times a week.    Record review: See summary above  Past Medical History:  Diagnosis Date   Always tired    Back pain    Bilateral swelling of feet and ankles    CAD (coronary artery disease)    a. [place] 02/2012 - s/p PTCA/Evolve II study stent to RCA 03/23/12, residual LAD dz   Chest pain    Erectile dysfunction    Hearing loss    Hepatitis B ~ 2000   "donated blood to [employer]; got letter; had shots over 6 months; gone now"   Hyperlipemia    Hypertension    Shortness of breath    Swallowing difficulty    Type II diabetes mellitus (HCC)      Family History  Problem Relation Age of Onset   Heart failure Mother    Diabetes Mother    Heart disease Mother    Sudden death Mother    Obesity Mother    Hypertension Father    Hyperlipidemia Father    Heart disease Father    Sudden death Father    Cancer Father  Obesity Father      Social History   Socioeconomic History   Marital status: Married    Spouse name: Not on file   Number of children: Not on file   Years of education: Not on file   Highest education level: Not on file  Occupational History   Occupation: Materials engineer  Tobacco Use   Smoking status: Former    Packs/day: 1.00    Years: 25.00    Total pack years: 25.00    Types: Cigarettes    Quit date: 09/22/2006    Years since quitting: 15.7   Smokeless tobacco: Never  Substance and Sexual Activity   Alcohol use: No   Drug use: No    Types: Cocaine, Marijuana, Heroin, Hashish    Comment: 03/23/12 "last drug use was in my teens"   Sexual activity: Yes  Other Topics Concern   Not on file  Social History Narrative   Not on file   Social Determinants of Health   Financial Resource Strain: Not on file  Food Insecurity: Not on file  Transportation Needs: Not on file  Physical Activity: Not on  file  Stress: Not on file  Social Connections: Not on file  Intimate Partner Violence: Not on file     No Known Allergies   Outpatient Medications Prior to Visit  Medication Sig Dispense Refill   aspirin EC 81 MG tablet Take 81 mg by mouth every morning.     atorvastatin (LIPITOR) 80 MG tablet Take 1 tablet (80 mg total) by mouth daily. 90 tablet 3   icosapent Ethyl (VASCEPA) 1 g capsule Take 2 capsules (2 g total) by mouth 2 (two) times daily. 270 capsule 3   insulin lispro (HUMALOG KWIKPEN) 200 UNIT/ML KwikPen Inject 20 Units into the skin daily. 20 to 36 units at meals, up to 60 units a day     metoprolol tartrate (LOPRESSOR) 25 MG tablet TAKE 1 TABLET BY MOUTH TWICE DAILY. 180 tablet 3   Multiple Vitamin (MULTIVITAMIN WITH MINERALS) TABS Take 1 tablet by mouth daily.     TOUJEO SOLOSTAR 300 UNIT/ML Solostar Pen Inject 70 Units into the skin daily.     TRULICITY 1.5 DG/6.4QI SOPN SMARTSIG:0.5 Milliliter(s) SUB-Q Once a Week     nitroGLYCERIN (NITROSTAT) 0.4 MG SL tablet Place 1 tablet (0.4 mg total) under the tongue every 5 (five) minutes x 3 doses as needed for chest pain. (Patient not taking: Reported on 01/31/2022) 25 tablet 6   Testosterone 20.25 MG/1.25GM (1.62%) GEL Place onto the skin. (Patient not taking: Reported on 06/03/2022)     valACYclovir (VALTREX) 500 MG tablet Take 500 mg by mouth 2 (two) times daily. (Patient not taking: Reported on 06/03/2022)     Vitamin D, Ergocalciferol, (DRISDOL) 1.25 MG (50000 UNIT) CAPS capsule TAKE 1 CAPSULE BY MOUTH EVERY 7 DAYS (Patient not taking: Reported on 06/03/2022) 4 capsule 0   No facility-administered medications prior to visit.    Review of Systems  Constitutional:  Negative for chills, fever, malaise/fatigue and weight loss.  HENT:  Positive for sore throat. Negative for congestion, nosebleeds and sinus pain.   Eyes:  Negative for photophobia, pain and discharge.  Respiratory:  Positive for cough and shortness of breath. Negative  for hemoptysis, sputum production and wheezing.   Cardiovascular:  Negative for chest pain, palpitations, orthopnea and leg swelling.  Gastrointestinal:  Negative for abdominal pain, constipation, diarrhea, nausea and vomiting.  Genitourinary:  Negative for dysuria, frequency, hematuria and urgency.  Musculoskeletal:  Negative for back pain, joint pain, myalgias and neck pain.  Skin:  Positive for itching and rash.  Neurological:  Negative for tingling, tremors, sensory change, speech change, focal weakness, seizures, weakness and headaches.  Psychiatric/Behavioral:  Negative for memory loss, substance abuse and suicidal ideas. The patient is not nervous/anxious.       Objective:  Physical Exam   Vitals:   06/03/22 0932  BP: 118/70  Pulse: 93  SpO2: 95%  Weight: 237 lb (107.5 kg)  Height: 5\' 7"  (1.702 m)   RA    Gen: obese, no acute distress HENT: NCAT, OP clear, neck supple without masses Eyes: PERRL, EOMi Lymph: no cervical lymphadenopathy PULM: CTA B CV: RRR, no mgr, no JVD GI: BS+, soft, nontender, no hsm Derm: excoriating rash over extensor surfaces and back MSK: normal bulk and tone Neuro: A&Ox4, CN II-XII intact, strength 5/5 in all 4 extremities Psyche: normal mood and affect  Left arm:   Right shoulder:    CBC    Component Value Date/Time   WBC 13.7 (H) 10/07/2020 0846   WBC 11.1 (H) 09/24/2013 1334   WBC 8.0 03/24/2012 0635   RBC 5.44 10/07/2020 0846   RBC 5.38 09/24/2013 1334   RBC 4.78 03/24/2012 0635   HGB 15.7 10/07/2020 0846   HGB 15.1 09/24/2013 1334   HCT 48.1 10/07/2020 0846   HCT 45.8 09/24/2013 1334   PLT 252 10/07/2020 0846   MCV 88 10/07/2020 0846   MCV 85.0 09/24/2013 1334   MCH 28.9 10/07/2020 0846   MCH 28.1 09/24/2013 1334   MCH 28.5 03/24/2012 0635   MCHC 32.6 10/07/2020 0846   MCHC 33.1 09/24/2013 1334   MCHC 33.4 03/24/2012 0635   RDW 14.1 10/07/2020 0846   RDW 13.9 09/24/2013 1334   LYMPHSABS 5.1 (H) 10/07/2020 0846    LYMPHSABS 4.6 (H) 09/24/2013 1334   MONOABS 0.8 09/24/2013 1334   EOSABS 0.1 10/07/2020 0846   BASOSABS 0.1 10/07/2020 0846   BASOSABS 0.0 09/24/2013 1334     Chest imaging: 2019 chest x-ray images independently reviewed showing normal pulmonary parenchyma, normal cardiac silhouette  PFT: 05/2022 ratio 92%, FEV1 2.0L 62% pred  Labs:  Path:  Echo:  Heart Catheterization:  Ambulatory oximetry: October 2023 walked 500 feet on room air and O2 saturation dropped no lower than 90%    Assessment & Plan:   Chronic cough - Plan: CBC with Differential/Platelet, IgE, DG Chest 2 View  SOB (shortness of breath) - Plan: Spirometry with graph, CBC with Differential/Platelet, IgE, DG Chest 2 View, Pulmonary function test  Morbid obesity due to excess calories (HCC)  Discussion: November 2023 is here to see me today for shortness of breath and cough.  Given his smoking history he needs to have chest imaging.  Spirometry today did not show airflow obstruction.  Its not clear to me how the new development of a rash this year is related to his symptoms.  I think the first best step is to check for an allergic reaction of some sort so we will check a CBC with differential and serum IgE.  Certainly, there is more systemic inflammatory conditions that could be considered.  Plan: Shortness of breath: Full pulmonary function testing Chest x-ray  New rash: I am glad you are going to see dermatology CBC with differential, serum IgE  Cough: Chest x-ray, as above  Possible obstructive sleep apnea: I will be eager to see the results of the sleep study later this month  Follow-up in about 4 to 6 weeks to go over the results of the lung function test.  Immunizations: Immunization History  Administered Date(s) Administered   Influenza,inj,Quad PF,6+ Mos 05/04/2022   Influenza,trivalent, recombinat, inj, PF 07/07/2014   Pneumococcal-Unspecified 03/10/2012     Current Outpatient Medications:     aspirin EC 81 MG tablet, Take 81 mg by mouth every morning., Disp: , Rfl:    atorvastatin (LIPITOR) 80 MG tablet, Take 1 tablet (80 mg total) by mouth daily., Disp: 90 tablet, Rfl: 3   icosapent Ethyl (VASCEPA) 1 g capsule, Take 2 capsules (2 g total) by mouth 2 (two) times daily., Disp: 270 capsule, Rfl: 3   insulin lispro (HUMALOG KWIKPEN) 200 UNIT/ML KwikPen, Inject 20 Units into the skin daily. 20 to 36 units at meals, up to 60 units a day, Disp: , Rfl:    metoprolol tartrate (LOPRESSOR) 25 MG tablet, TAKE 1 TABLET BY MOUTH TWICE DAILY., Disp: 180 tablet, Rfl: 3   Multiple Vitamin (MULTIVITAMIN WITH MINERALS) TABS, Take 1 tablet by mouth daily., Disp: , Rfl:    TOUJEO SOLOSTAR 300 UNIT/ML Solostar Pen, Inject 70 Units into the skin daily., Disp: , Rfl:    TRULICITY 1.5 MG/0.5ML SOPN, SMARTSIG:0.5 Milliliter(s) SUB-Q Once a Week, Disp: , Rfl:    nitroGLYCERIN (NITROSTAT) 0.4 MG SL tablet, Place 1 tablet (0.4 mg total) under the tongue every 5 (five) minutes x 3 doses as needed for chest pain. (Patient not taking: Reported on 01/31/2022), Disp: 25 tablet, Rfl: 6   Testosterone 20.25 MG/1.25GM (1.62%) GEL, Place onto the skin. (Patient not taking: Reported on 06/03/2022), Disp: , Rfl:    valACYclovir (VALTREX) 500 MG tablet, Take 500 mg by mouth 2 (two) times daily. (Patient not taking: Reported on 06/03/2022), Disp: , Rfl:    Vitamin D, Ergocalciferol, (DRISDOL) 1.25 MG (50000 UNIT) CAPS capsule, TAKE 1 CAPSULE BY MOUTH EVERY 7 DAYS (Patient not taking: Reported on 06/03/2022), Disp: 4 capsule, Rfl: 0

## 2022-06-03 ENCOUNTER — Ambulatory Visit (INDEPENDENT_AMBULATORY_CARE_PROVIDER_SITE_OTHER): Payer: BC Managed Care – PPO

## 2022-06-03 ENCOUNTER — Encounter: Payer: Self-pay | Admitting: Pulmonary Disease

## 2022-06-03 ENCOUNTER — Ambulatory Visit (INDEPENDENT_AMBULATORY_CARE_PROVIDER_SITE_OTHER): Payer: BC Managed Care – PPO | Admitting: Pulmonary Disease

## 2022-06-03 VITALS — BP 118/70 | HR 93 | Ht 67.0 in | Wt 237.0 lb

## 2022-06-03 DIAGNOSIS — R0600 Dyspnea, unspecified: Secondary | ICD-10-CM | POA: Diagnosis not present

## 2022-06-03 DIAGNOSIS — R059 Cough, unspecified: Secondary | ICD-10-CM | POA: Diagnosis not present

## 2022-06-03 DIAGNOSIS — R0602 Shortness of breath: Secondary | ICD-10-CM

## 2022-06-03 DIAGNOSIS — R053 Chronic cough: Secondary | ICD-10-CM

## 2022-06-03 LAB — CBC WITH DIFFERENTIAL/PLATELET
Basophils Absolute: 0 10*3/uL (ref 0.0–0.1)
Basophils Relative: 0.4 % (ref 0.0–3.0)
Eosinophils Absolute: 0.1 10*3/uL (ref 0.0–0.7)
Eosinophils Relative: 0.9 % (ref 0.0–5.0)
HCT: 44.6 % (ref 39.0–52.0)
Hemoglobin: 14.6 g/dL (ref 13.0–17.0)
Lymphocytes Relative: 35.9 % (ref 12.0–46.0)
Lymphs Abs: 4 10*3/uL (ref 0.7–4.0)
MCHC: 32.8 g/dL (ref 30.0–36.0)
MCV: 87.7 fl (ref 78.0–100.0)
Monocytes Absolute: 1 10*3/uL (ref 0.1–1.0)
Monocytes Relative: 8.9 % (ref 3.0–12.0)
Neutro Abs: 5.9 10*3/uL (ref 1.4–7.7)
Neutrophils Relative %: 53.9 % (ref 43.0–77.0)
Platelets: 222 10*3/uL (ref 150.0–400.0)
RBC: 5.09 Mil/uL (ref 4.22–5.81)
RDW: 14.2 % (ref 11.5–15.5)
WBC: 11 10*3/uL — ABNORMAL HIGH (ref 4.0–10.5)

## 2022-06-03 NOTE — Patient Instructions (Signed)
Shortness of breath: Full pulmonary function testing Chest x-ray  New rash: I am glad you are going to see dermatology CBC with differential, serum IgE  Cough: Chest x-ray, as above  Possible obstructive sleep apnea: I will be eager to see the results of the sleep study later this month  Follow-up in about 4 to 6 weeks to go over the results of the lung function test.

## 2022-06-06 ENCOUNTER — Other Ambulatory Visit: Payer: Self-pay

## 2022-06-06 DIAGNOSIS — R9389 Abnormal findings on diagnostic imaging of other specified body structures: Secondary | ICD-10-CM

## 2022-06-06 DIAGNOSIS — R0602 Shortness of breath: Secondary | ICD-10-CM

## 2022-06-06 LAB — IGE: IgE (Immunoglobulin E), Serum: 24 kU/L (ref <=114)

## 2022-07-06 ENCOUNTER — Ambulatory Visit
Admission: RE | Admit: 2022-07-06 | Discharge: 2022-07-06 | Disposition: A | Discharge status: Home / Self Care | Payer: BC Managed Care – PPO | Source: Ambulatory Visit | Attending: Pulmonary Disease | Admitting: Pulmonary Disease

## 2022-07-06 DIAGNOSIS — R0602 Shortness of breath: Secondary | ICD-10-CM

## 2022-07-06 DIAGNOSIS — I700 Atherosclerosis of aorta: Secondary | ICD-10-CM | POA: Diagnosis not present

## 2022-07-06 DIAGNOSIS — R9389 Abnormal findings on diagnostic imaging of other specified body structures: Secondary | ICD-10-CM

## 2022-07-06 DIAGNOSIS — R059 Cough, unspecified: Secondary | ICD-10-CM | POA: Diagnosis not present

## 2022-07-06 DIAGNOSIS — R911 Solitary pulmonary nodule: Secondary | ICD-10-CM | POA: Diagnosis not present

## 2022-07-07 ENCOUNTER — Telehealth: Payer: Self-pay | Admitting: Pulmonary Disease

## 2022-07-07 DIAGNOSIS — K769 Liver disease, unspecified: Secondary | ICD-10-CM

## 2022-07-07 NOTE — Telephone Encounter (Signed)
Received call report from Tiffany with [facility] on patient's HRCT done on 11/15. Dr. Kendrick Fries, please review the result/impression copied below:  IMPRESSION: 1. No evidence of fibrotic interstitial lung disease. Bland appearing, bandlike scarring of the bilateral lung bases, unchanged compared to prior examination. 2. Lobular air trapping on expiratory phase imaging, suggesting small airways disease. 3. Stable, definitively benign nodule of the dependent left lower lobe, for which no further follow-up characterization is required. 4. Hypodense lesion of the liver adjacent to the gallbladder fossa, measuring 2.1 x 2.0 cm. This appears to have been present on remote prior examination dated 02/27/2015 although is substantially increased in size. Although most likely benign given indolent behavior, this is incompletely characterized. Recommend multiphasic contrast enhanced MRI for complete characterization. 5. Coronary artery disease.  Please advise, thank you.

## 2022-07-07 NOTE — Telephone Encounter (Signed)
Attempted to call Fernando Nelson to discuss the result of the HRCT that showed a new hypodense lesion in his liver.  Unfortunately the call went straight to voicemail.  Will order MRI of liver as per radiology recommendations.  For [facility] triage: please let me know if the patient calls back so I can discuss the results with him.

## 2022-07-08 ENCOUNTER — Encounter: Payer: Self-pay | Admitting: Pulmonary Disease

## 2022-07-08 NOTE — Telephone Encounter (Signed)
Routing encounter to Dr. Kendrick Fries so he can try to reach back out to pt.

## 2022-07-08 NOTE — Telephone Encounter (Signed)
Patient is returning phone call. Patient phone number is [phone].

## 2022-07-08 NOTE — Telephone Encounter (Signed)
Lupita Leash, MD  You1 minute ago (12:51 PM)    Called and spoke to patient today explaining results of CT scan.  Triage, Please refer him to [facility] GI medicine for liver mass  He's been seen by them in the past     Referral has been placed. Nothing further needed.

## 2022-07-12 ENCOUNTER — Telehealth: Payer: Self-pay | Admitting: *Deleted

## 2022-07-12 NOTE — Telephone Encounter (Signed)
Fernando Nelson from [facility] called to get MRI fax order sent to [facility] [phone]

## 2022-07-18 ENCOUNTER — Ambulatory Visit (HOSPITAL_COMMUNITY)
Admission: RE | Admit: 2022-07-18 | Discharge: 2022-07-18 | Disposition: A | Discharge status: Home / Self Care | Payer: BC Managed Care – PPO | Source: Ambulatory Visit | Attending: Pulmonary Disease | Admitting: Pulmonary Disease

## 2022-07-18 DIAGNOSIS — K769 Liver disease, unspecified: Secondary | ICD-10-CM | POA: Diagnosis not present

## 2022-07-18 DIAGNOSIS — K7689 Other specified diseases of liver: Secondary | ICD-10-CM | POA: Diagnosis not present

## 2022-07-18 MED ORDER — GADOBUTROL 1 MMOL/ML IV SOLN
10.0000 mL | Freq: Once | INTRAVENOUS | Status: AC | PRN
  Administered 2022-07-18: 10 mL via INTRAVENOUS

## 2022-07-19 ENCOUNTER — Encounter: Payer: Self-pay | Admitting: Pulmonary Disease

## 2022-07-19 ENCOUNTER — Ambulatory Visit (INDEPENDENT_AMBULATORY_CARE_PROVIDER_SITE_OTHER): Payer: BC Managed Care – PPO | Admitting: Pulmonary Disease

## 2022-07-19 ENCOUNTER — Ambulatory Visit: Payer: BC Managed Care – PPO | Admitting: Pulmonary Disease

## 2022-07-19 VITALS — BP 126/78 | HR 89 | Ht 67.0 in | Wt 241.0 lb

## 2022-07-19 DIAGNOSIS — R0602 Shortness of breath: Secondary | ICD-10-CM | POA: Diagnosis not present

## 2022-07-19 LAB — PULMONARY FUNCTION TEST
DL/VA % pred: 124 %
DL/VA: 5.32 ml/min/mmHg/L
DLCO cor % pred: 76 %
DLCO cor: 19.23 ml/min/mmHg
DLCO unc % pred: 76 %
DLCO unc: 19.23 ml/min/mmHg
FEF 25-75 Post: 2.74 L/sec
FEF 25-75 Pre: 2.4 L/sec
FEF2575-%Change-Post: 14 %
FEF2575-%Pred-Post: 101 %
FEF2575-%Pred-Pre: 88 %
FEV1-%Change-Post: 1 %
FEV1-%Pred-Post: 63 %
FEV1-%Pred-Pre: 61 %
FEV1-Post: 2.04 L
FEV1-Pre: 2 L
FEV1FVC-%Change-Post: 2 %
FEV1FVC-%Pred-Pre: 113 %
FEV6-%Change-Post: 0 %
FEV6-%Pred-Post: 56 %
FEV6-%Pred-Pre: 57 %
FEV6-Post: 2.31 L
FEV6-Pre: 2.33 L
FEV6FVC-%Pred-Post: 105 %
FEV6FVC-%Pred-Pre: 105 %
FVC-%Change-Post: 0 %
FVC-%Pred-Post: 54 %
FVC-%Pred-Pre: 54 %
FVC-Post: 2.31 L
FVC-Pre: 2.33 L
Post FEV1/FVC ratio: 88 %
Post FEV6/FVC ratio: 100 %
Pre FEV1/FVC ratio: 86 %
Pre FEV6/FVC Ratio: 100 %
RV % pred: 88 %
RV: 1.85 L
TLC % pred: 68 %
TLC: 4.4 L

## 2022-07-19 NOTE — Patient Instructions (Signed)
Cyst/lesion on the liver: Follow-up with gastroenterology The MRI seemed to indicate that this was benign  Restrictive lung disease secondary to obesity: Continue to exercise regularly and try to lose weight Exercise is an important way to stay healthy, live longer, and have a better quality of life. You should dedicate time 3-5 days a week to intentional exercise. Start by planning a workout you know you can complete.  Once you have completed it try repeating it 2 more times in a week before you make it harder. The best and most effective exercise routines gradually get more difficult (the walk is longer, the run is faster, the weight is higher, etc.). Your consistency will guarantee success more than any other factor: stick with it no matter what.  No excuses. It's OK to feel short of breath when you exercise, but lightheadedness or pain is not OK.   If you are not comfortable exercising on your own let me know so we can find someone to help you.  I will see you back on an as-needed basis.

## 2022-07-19 NOTE — Progress Notes (Signed)
PFT done today. 

## 2022-07-19 NOTE — Progress Notes (Signed)
Synopsis: Referred in October 2023 for chronic cough.  He had previously been evaluated by Dr. Marene Lenz with otolaryngology.  A laryngoscopy demonstrated findings of edema and inflammation of the arytenoids and vocal cords.  The patient was started on twice daily proton pump inhibitor treatment but apparently reported no improvement in symptoms.  The patient was also referred for a sleep study which was not performed.  History of CAD had stent around 2003.    Subjective:   PATIENT ID: Fernando Nelson GENDER: male DOB: [birth year], MRN: [id number]   HPI  Chief Complaint  Patient presents with   Follow-up    Fernando Nelson Stable is here to follow-up the results of his PFT, MRI of his abdomen.  He says that his breathing is about the same.  He continues to workout for about 45 minutes several times per day.  Breathing is unchanged.  No cough, no bronchitis, no pneumonia since last visit.  He has not seen a dermatologist yet.  Past Medical History:  Diagnosis Date   Always tired    Back pain    Bilateral swelling of feet and ankles    CAD (coronary artery disease)    a. [place] 02/2012 - s/p PTCA/Evolve II study stent to RCA 03/23/12, residual LAD dz   Chest pain    Erectile dysfunction    Hearing loss    Hepatitis B ~ 2000   "donated blood to [employer]; got letter; had shots over 6 months; gone now"   Hyperlipemia    Hypertension    Shortness of breath    Swallowing difficulty    Type II diabetes mellitus (HCC)       ROS    Objective:  Physical Exam   Vitals:   07/19/22 1347  BP: 126/78  Pulse: 89  SpO2: 94%  Weight: 241 lb (109.3 kg)  Height: 5\' 7"  (1.702 m)   RA  Gen: well appearing HENT: OP clear, neck supple PULM: CTA B, normal effort  CV: RRR, no mgr GI: BS+, soft, nontender Derm: scaling extensor surface rash, salmon base Psyche: normal mood and affect    CBC    Component Value Date/Time   WBC 11.0 (H) 06/03/2022 1019   RBC 5.09 06/03/2022 1019   HGB 14.6 06/03/2022  1019   HGB 15.7 10/07/2020 0846   HGB 15.1 09/24/2013 1334   HCT 44.6 06/03/2022 1019   HCT 48.1 10/07/2020 0846   HCT 45.8 09/24/2013 1334   PLT 222.0 06/03/2022 1019   PLT 252 10/07/2020 0846   MCV 87.7 06/03/2022 1019   MCV 88 10/07/2020 0846   MCV 85.0 09/24/2013 1334   MCH 28.9 10/07/2020 0846   MCH 28.1 09/24/2013 1334   MCH 28.5 03/24/2012 0635   MCHC 32.8 06/03/2022 1019   RDW 14.2 06/03/2022 1019   RDW 14.1 10/07/2020 0846   RDW 13.9 09/24/2013 1334   LYMPHSABS 4.0 06/03/2022 1019   LYMPHSABS 5.1 (H) 10/07/2020 0846   LYMPHSABS 4.6 (H) 09/24/2013 1334   MONOABS 1.0 06/03/2022 1019   MONOABS 0.8 09/24/2013 1334   EOSABS 0.1 06/03/2022 1019   EOSABS 0.1 10/07/2020 0846   BASOSABS 0.0 06/03/2022 1019   BASOSABS 0.1 10/07/2020 0846   BASOSABS 0.0 09/24/2013 1334     Chest imaging: 2019 chest x-ray images independently reviewed showing normal pulmonary parenchyma, normal cardiac silhouette 06/2022 HRCT> bandlike scarring bilateral lung bases, unchanged compared to prior, no fibrotic lung disease, lobular air trappine, left lower lobe nodule stable, CAD  PFT:  05/2022 ratio 92%, FEV1 2.0L 62% pred November 2023 ratio 88%, FVC 2.31 L 54% predicted, total lung capacity 4.40 L 68% predicted, ERV 12% predicted, DLCO 19.23 76% predicted  Labs:  Path:  Echo:  Heart Catheterization:  Ambulatory oximetry: October 2023 walked 500 feet on room air and O2 saturation dropped no lower than 90%    Assessment & Plan:   SOB (shortness of breath)  Morbid obesity due to excess calories (HCC)  Discussion Bill has moderate to severe restrictive lung disease secondary to obesity.  There is no evidence of a pulmonary parenchymal problem.  I explained to him that he essentially has normal lungs which are trapped in an obese body and he is unable to take a deep breath.  The best thing he can do is to exercise and lose weight.  He needs to try to progress his exercise routine more.   I am glad that the liver mass appears benign on MRI.  Plan: Cyst/lesion on the liver: Follow-up with gastroenterology The MRI seemed to indicate that this was benign  Restrictive lung disease secondary to obesity: Continue to exercise regularly and try to lose weight Exercise is an important way to stay healthy, live longer, and have a better quality of life. You should dedicate time 3-5 days a week to intentional exercise. Start by planning a workout you know you can complete.  Once you have completed it try repeating it 2 more times in a week before you make it harder. The best and most effective exercise routines gradually get more difficult (the walk is longer, the run is faster, the weight is higher, etc.). Your consistency will guarantee success more than any other factor: stick with it no matter what.  No excuses. It's OK to feel short of breath when you exercise, but lightheadedness or pain is not OK.   If you are not comfortable exercising on your own let me know so we can find someone to help you.  I will see you back on an as-needed basis. Immunizations: Immunization History  Administered Date(s) Administered   Influenza,inj,Quad PF,6+ Mos 05/04/2022   Influenza,trivalent, recombinat, inj, PF 07/07/2014   Pneumococcal-Unspecified 03/10/2012     Current Outpatient Medications:    aspirin EC 81 MG tablet, Take 81 mg by mouth every morning., Disp: , Rfl:    atorvastatin (LIPITOR) 80 MG tablet, Take 1 tablet (80 mg total) by mouth daily., Disp: 90 tablet, Rfl: 3   icosapent Ethyl (VASCEPA) 1 g capsule, Take 2 capsules (2 g total) by mouth 2 (two) times daily., Disp: 270 capsule, Rfl: 3   insulin lispro (HUMALOG KWIKPEN) 200 UNIT/ML KwikPen, Inject 20 Units into the skin daily. 20 to 36 units at meals, up to 60 units a day, Disp: , Rfl:    metoprolol tartrate (LOPRESSOR) 25 MG tablet, TAKE 1 TABLET BY MOUTH TWICE DAILY., Disp: 180 tablet, Rfl: 3   Multiple Vitamin (MULTIVITAMIN  WITH MINERALS) TABS, Take 1 tablet by mouth daily., Disp: , Rfl:    nitroGLYCERIN (NITROSTAT) 0.4 MG SL tablet, Place 1 tablet (0.4 mg total) under the tongue every 5 (five) minutes x 3 doses as needed for chest pain., Disp: 25 tablet, Rfl: 6   Testosterone 20.25 MG/1.25GM (1.62%) GEL, Place onto the skin., Disp: , Rfl:    TOUJEO SOLOSTAR 300 UNIT/ML Solostar Pen, Inject 70 Units into the skin daily., Disp: , Rfl:    TRULICITY 1.5 MG/0.5ML SOPN, SMARTSIG:0.5 Milliliter(s) SUB-Q Once a Week, Disp: , Rfl:  valACYclovir (VALTREX) 500 MG tablet, Take 500 mg by mouth 2 (two) times daily., Disp: , Rfl:    Vitamin D, Ergocalciferol, (DRISDOL) 1.25 MG (50000 UNIT) CAPS capsule, TAKE 1 CAPSULE BY MOUTH EVERY 7 DAYS, Disp: 4 capsule, Rfl: 0

## 2022-08-03 DIAGNOSIS — B1920 Unspecified viral hepatitis C without hepatic coma: Secondary | ICD-10-CM | POA: Diagnosis not present

## 2022-08-03 DIAGNOSIS — Z86010 Personal history of colonic polyps: Secondary | ICD-10-CM | POA: Diagnosis not present

## 2022-08-03 DIAGNOSIS — K769 Liver disease, unspecified: Secondary | ICD-10-CM | POA: Diagnosis not present

## 2022-08-03 DIAGNOSIS — K869 Disease of pancreas, unspecified: Secondary | ICD-10-CM | POA: Diagnosis not present

## 2022-10-10 ENCOUNTER — Ambulatory Visit: Payer: BC Managed Care – PPO | Admitting: Podiatry

## 2022-10-10 VITALS — BP 130/83 | HR 97

## 2022-10-10 DIAGNOSIS — E119 Type 2 diabetes mellitus without complications: Secondary | ICD-10-CM

## 2022-10-10 MED ORDER — GABAPENTIN 100 MG PO CAPS: 100.0000 mg | ORAL_CAPSULE | Freq: Three times a day (TID) | ORAL | 3 refills | Status: DC

## 2022-10-10 NOTE — Progress Notes (Signed)
Chief Complaint  Patient presents with   Diabetes    Diabetic Foot care, A1c-9.3 BG- 96, Numbness, burning, tingling  in feet, started 6 years seen by Lewie Chamber,     HPI: 62 y.o. male PMHx uncontrolled DM type II presenting today for evaluation of numbness with tingling and burning sensation to the bilateral feet that has been ongoing for about 6 years.  Patient states that he had nerve conduction studies to the bilateral lower extremities which returned WNL.  He presents for further treatment and evaluation  Past Medical History:  Diagnosis Date   Always tired    Back pain    Bilateral swelling of feet and ankles    CAD (coronary artery disease)    a. [place] 02/2012 - s/p PTCA/Evolve II study stent to RCA 03/23/12, residual LAD dz   Chest pain    Erectile dysfunction    Hearing loss    Hepatitis B ~ 2000   "donated blood to [employer]; got letter; had shots over 6 months; gone now"   Hyperlipemia    Hypertension    Shortness of breath    Swallowing difficulty    Type II diabetes mellitus ([place])     Past Surgical History:  Procedure Laterality Date   CORONARY ANGIOPLASTY WITH STENT PLACEMENT  03/23/12   "1; first one ever"   LEFT HEART CATHETERIZATION WITH CORONARY ANGIOGRAM N/A 03/23/2012   Procedure: LEFT HEART CATHETERIZATION WITH CORONARY ANGIOGRAM;  Surgeon: Jolaine Artist, MD;  Location: [facility] C[facility] LAB;  Service: Cardiovascular;  Laterality: N/A;   PERCUTANEOUS CORONARY STENT INTERVENTION (PCI-S)  03/23/2012   Procedure: PERCUTANEOUS CORONARY STENT INTERVENTION (PCI-S);  Surgeon: Jolaine Artist, MD;  Location: [facility] CATH LAB;  Service: Cardiovascular;;   SHOULDER OPEN ROTATOR CUFF REPAIR  2005   left   VASECTOMY      No Known Allergies   Physical Exam: General: The patient is alert and oriented x3 in no acute distress.  Dermatology: Skin is warm, dry and supple bilateral lower extremities. Negative for open lesions or macerations.  No open wounds  Vascular: Palpable pedal  pulses bilaterally. Capillary refill within normal limits.  Negative for any significant edema or erythema  Neurological: Light touch and protective threshold diminished  Musculoskeletal Exam: No pedal deformities noted   Assessment: 1.  T2DM uncontrolled 2.  Diabetic peripheral polyneuropathy   Plan of Care:  1. Patient evaluated. X-Rays reviewed.  2.  Comprehensive diabetic foot exam performed today 3.  Stressed the importance of controlling his diabetes.  Explained that although diabetic neuropathy is irreversible, if he is able to lower his A1c levels and better manage his diabetes he should see a slight improvement in the neuropathy symptoms that he is experiencing 4.  Prescription for gabapentin 100 mg 3 times daily.  If this does help alleviate some of his symptoms recommend long-term management of the gabapentin with his PCP 5.  Return to clinic annually       Edrick Kins, DPM [facility]  Dr. Edrick Kins, DPM    [address], [place] [address]                Office [phone]  Fax [phone]

## 2022-11-05 ENCOUNTER — Ambulatory Visit
Admission: EM | Admit: 2022-11-05 | Discharge: 2022-11-05 | Disposition: A | Discharge status: Home / Self Care | Payer: BC Managed Care – PPO | Attending: Urgent Care | Admitting: Urgent Care

## 2022-11-05 DIAGNOSIS — R112 Nausea with vomiting, unspecified: Secondary | ICD-10-CM

## 2022-11-05 DIAGNOSIS — R109 Unspecified abdominal pain: Secondary | ICD-10-CM

## 2022-11-05 LAB — POCT URINALYSIS DIP (MANUAL ENTRY)
Bilirubin, UA: NEGATIVE
Glucose, UA: NEGATIVE mg/dL
Ketones, POC UA: NEGATIVE mg/dL
Leukocytes, UA: NEGATIVE
Nitrite, UA: NEGATIVE
Protein Ur, POC: >=300 mg/dL — AB
Spec Grav, UA: 1.02 (ref 1.010–1.025)
Urobilinogen, UA: 0.2 E.U./dL
pH, UA: 8.5 — AB (ref 5.0–8.0)

## 2022-11-05 LAB — POCT FASTING CBG KUC MANUAL ENTRY: POCT Glucose (KUC): 179 mg/dL — AB (ref 70–99)

## 2022-11-05 MED ORDER — ONDANSETRON HCL 4 MG/2ML IJ SOLN
4.0000 mg | Freq: Once | INTRAMUSCULAR | Status: AC
  Administered 2022-11-05: 4 mg via INTRAMUSCULAR

## 2022-11-05 MED ORDER — ONDANSETRON 8 MG PO TBDP: 8.0000 mg | ORAL_TABLET | Freq: Three times a day (TID) | ORAL | 0 refills | Status: DC | PRN

## 2022-11-05 NOTE — ED Triage Notes (Signed)
Pt states that he has some vomiting. X1 day

## 2022-11-05 NOTE — Discharge Instructions (Signed)
Make sure you push fluids drinking mostly water but mix it with Gatorade.  Try to eat light meals including soups, broths and soft foods, fruits.  You may use Zofran for your nausea and vomiting once every 8 hours.   Please return to the clinic if symptoms worsen or you start having severe abdominal pain not helped by taking Tylenol or start having bloody stools or blood in the vomit.  

## 2022-11-05 NOTE — ED Provider Notes (Signed)
[facility] - URGENT CARE CENTER  Note:  This document was prepared using Systems analyst and may include unintentional dictation errors.  MRN: [id number] DOB: 1961/02/09  Subjective:   Fernando Nelson is a 62 y.o. male presenting for 1 day history of acute onset persistent nausea with vomiting.  Has had multiple episodes.   Patient is currently taking a course of amoxicillin for a root canal, last dosing was yesterday.  No diarrhea, bloody stools, loose stools.  No fever, hospitalizations or long distance travel.  Has not eaten raw foods, drank unfiltered water.  No history of GI disorders including Crohn's, IBS, ulcerative colitis.    No current facility-administered medications for this encounter.  Current Outpatient Medications:    aspirin EC 81 MG tablet, Take 81 mg by mouth every morning., Disp: , Rfl:    atorvastatin (LIPITOR) 80 MG tablet, Take 1 tablet (80 mg total) by mouth daily., Disp: 90 tablet, Rfl: 3   gabapentin (NEURONTIN) 100 MG capsule, Take 1 capsule (100 mg total) by mouth 3 (three) times daily., Disp: 90 capsule, Rfl: 3   icosapent Ethyl (VASCEPA) 1 g capsule, Take 2 capsules (2 g total) by mouth 2 (two) times daily., Disp: 270 capsule, Rfl: 3   insulin lispro (HUMALOG KWIKPEN) 200 UNIT/ML KwikPen, Inject 20 Units into the skin daily. 20 to 36 units at meals, up to 60 units a day, Disp: , Rfl:    metoprolol tartrate (LOPRESSOR) 25 MG tablet, TAKE 1 TABLET BY MOUTH TWICE DAILY., Disp: 180 tablet, Rfl: 3   Multiple Vitamin (MULTIVITAMIN WITH MINERALS) TABS, Take 1 tablet by mouth daily., Disp: , Rfl:    nitroGLYCERIN (NITROSTAT) 0.4 MG SL tablet, Place 1 tablet (0.4 mg total) under the tongue every 5 (five) minutes x 3 doses as needed for chest pain., Disp: 25 tablet, Rfl: 6   Testosterone 20.25 MG/1.25GM (1.62%) GEL, Place onto the skin., Disp: , Rfl:    TOUJEO SOLOSTAR 300 UNIT/ML Solostar Pen, Inject 70 Units into the skin daily., Disp: , Rfl:     TRULICITY 1.5 0000000 SOPN, SMARTSIG:0.5 Milliliter(s) SUB-Q Once a Week, Disp: , Rfl:    valACYclovir (VALTREX) 500 MG tablet, Take 500 mg by mouth 2 (two) times daily., Disp: , Rfl:    Vitamin D, Ergocalciferol, (DRISDOL) 1.25 MG (50000 UNIT) CAPS capsule, TAKE 1 CAPSULE BY MOUTH EVERY 7 DAYS, Disp: 4 capsule, Rfl: 0   No Known Allergies  Past Medical History:  Diagnosis Date   Always tired    Back pain    Bilateral swelling of feet and ankles    CAD (coronary artery disease)    a. [place] 02/2012 - s/p PTCA/Evolve II study stent to RCA 03/23/12, residual LAD dz   Chest pain    Erectile dysfunction    Hearing loss    Hepatitis B ~ 2000   "donated blood to [employer]; got letter; had shots over 6 months; gone now"   Hyperlipemia    Hypertension    Shortness of breath    Swallowing difficulty    Type II diabetes mellitus ([place])      Past Surgical History:  Procedure Laterality Date   CORONARY ANGIOPLASTY WITH STENT PLACEMENT  03/23/12   "1; first one ever"   LEFT HEART CATHETERIZATION WITH CORONARY ANGIOGRAM N/A 03/23/2012   Procedure: LEFT HEART CATHETERIZATION WITH CORONARY ANGIOGRAM;  Surgeon: Jolaine Artist, MD;  Location: [facility] CATH LAB;  Service: Cardiovascular;  Laterality: N/A;   PERCUTANEOUS CORONARY STENT  INTERVENTION (PCI-S)  03/23/2012   Procedure: PERCUTANEOUS CORONARY STENT INTERVENTION (PCI-S);  Surgeon: Jolaine Artist, MD;  Location: [facility] CATH LAB;  Service: Cardiovascular;;   SHOULDER OPEN ROTATOR CUFF REPAIR  2005   left   VASECTOMY      Family History  Problem Relation Age of Onset   Heart failure Mother    Diabetes Mother    Heart disease Mother    Sudden death Mother    Obesity Mother    Hypertension Father    Hyperlipidemia Father    Heart disease Father    Sudden death Father    Cancer Father    Obesity Father     Social History   Tobacco Use   Smoking status: Former    Packs/day: 1.00    Years: 25.00    Additional pack years: 0.00    Total pack  years: 25.00    Types: Cigarettes    Quit date: 09/22/2006    Years since quitting: 16.1   Smokeless tobacco: Never  Substance Use Topics   Alcohol use: No   Drug use: No    Types: Cocaine, Marijuana, Heroin, Hashish    Comment: 03/23/12 "last drug use was in my teens"    ROS   Objective:   Vitals: BP 126/79 (BP Location: Left Arm)   Pulse (!) 105   Temp 98.7 F (37.1 C) (Oral)   Resp 18   Ht 5\' 7"  (1.702 m)   Wt 240 lb (108.9 kg)   SpO2 93%   BMI 37.59 kg/m   Physical Exam Constitutional:      General: He is not in acute distress.    Appearance: Normal appearance. He is well-developed and normal weight. He is not ill-appearing, toxic-appearing or diaphoretic.  HENT:     Head: Normocephalic and atraumatic.     Right Ear: External ear normal.     Left Ear: External ear normal.     Nose: Nose normal.  Eyes:     General: No scleral icterus.       Right eye: No discharge.        Left eye: No discharge.     Extraocular Movements: Extraocular movements intact.     Conjunctiva/sclera: Conjunctivae normal.  Cardiovascular:     Rate and Rhythm: Normal rate and regular rhythm.     Heart sounds: No murmur heard.    No friction rub. No gallop.  Pulmonary:     Effort: Pulmonary effort is normal. No respiratory distress.     Breath sounds: No wheezing or rales.  Abdominal:     General: Bowel sounds are increased. There is no distension.     Palpations: Abdomen is soft. There is no mass.     Tenderness: There is no abdominal tenderness. There is no right CVA tenderness, left CVA tenderness, guarding or rebound.  Musculoskeletal:     Cervical back: Normal range of motion.  Skin:    General: Skin is warm and dry.  Neurological:     Mental Status: He is alert and oriented to person, place, and time.  Psychiatric:        Mood and Affect: Mood normal.        Behavior: Behavior normal.        Thought Content: Thought content normal.        Judgment: Judgment normal.      Results for orders placed or performed during the hospital encounter of 11/05/22 (from the past 24 hour(s))  POCT CBG (  manual entry)     Status: Abnormal   Collection Time: 11/05/22  9:54 AM  Result Value Ref Range   POCT Glucose ([facility]) 179 (A) 70 - 99 mg/dL  POCT urinalysis dipstick     Status: Abnormal   Collection Time: 11/05/22 10:06 AM  Result Value Ref Range   Color, UA yellow yellow   Clarity, UA clear clear   Glucose, UA negative negative mg/dL   Bilirubin, UA negative negative   Ketones, POC UA negative negative mg/dL   Spec Grav, UA 1.020 1.010 - 1.025   Blood, UA trace-intact (A) negative   pH, UA 8.5 (A) 5.0 - 8.0   Protein Ur, POC >=300 (A) negative mg/dL   Urobilinogen, UA 0.2 0.2 or 1.0 E.U./dL   Nitrite, UA Negative Negative   Leukocytes, UA Negative Negative   IM Zofran administered at 4 mg in clinic.  Assessment and Plan :   PDMP not reviewed this encounter.  1. Nausea and vomiting, unspecified vomiting type   2. Belly pain     Will hold off on C. difficile screen as he has no loose stools, watery diarrhea, bloody stools.  Recommended conservative management, supportive care for his nausea and vomiting.  This is likely related to his use of amoxicillin.  Follow-up with his dental surgeon to see if he should continue this.  Counseled patient on potential for adverse effects with medications prescribed/recommended today, ER and return-to-clinic precautions discussed, patient verbalized understanding.    Jaynee Eagles, PA-C 11/05/22 1034

## 2022-11-11 DIAGNOSIS — E113313 Type 2 diabetes mellitus with moderate nonproliferative diabetic retinopathy with macular edema, bilateral: Secondary | ICD-10-CM | POA: Diagnosis not present

## 2022-12-08 DIAGNOSIS — Z125 Encounter for screening for malignant neoplasm of prostate: Secondary | ICD-10-CM | POA: Diagnosis not present

## 2022-12-08 DIAGNOSIS — E1142 Type 2 diabetes mellitus with diabetic polyneuropathy: Secondary | ICD-10-CM | POA: Diagnosis not present

## 2022-12-08 DIAGNOSIS — R5383 Other fatigue: Secondary | ICD-10-CM | POA: Diagnosis not present

## 2022-12-08 DIAGNOSIS — Z0000 Encounter for general adult medical examination without abnormal findings: Secondary | ICD-10-CM | POA: Diagnosis not present

## 2022-12-08 DIAGNOSIS — E782 Mixed hyperlipidemia: Secondary | ICD-10-CM | POA: Diagnosis not present

## 2023-01-05 DIAGNOSIS — I10 Essential (primary) hypertension: Secondary | ICD-10-CM | POA: Diagnosis not present

## 2023-01-05 DIAGNOSIS — E669 Obesity, unspecified: Secondary | ICD-10-CM | POA: Diagnosis not present

## 2023-01-05 DIAGNOSIS — G4733 Obstructive sleep apnea (adult) (pediatric): Secondary | ICD-10-CM | POA: Diagnosis not present

## 2023-01-13 DIAGNOSIS — E1142 Type 2 diabetes mellitus with diabetic polyneuropathy: Secondary | ICD-10-CM | POA: Diagnosis not present

## 2023-01-13 DIAGNOSIS — E1165 Type 2 diabetes mellitus with hyperglycemia: Secondary | ICD-10-CM | POA: Diagnosis not present

## 2023-01-13 DIAGNOSIS — E782 Mixed hyperlipidemia: Secondary | ICD-10-CM | POA: Diagnosis not present

## 2023-02-20 ENCOUNTER — Encounter: Payer: Self-pay | Admitting: Cardiovascular Disease

## 2023-02-20 NOTE — Progress Notes (Unsigned)
Fernando Nelson Date of Birth  [birth year]       [facility]    [place] [address]  [address] [place], [place]  [address]   [place], [place]  [address] [phone]     [phone]   Fax  [phone]    Fax [phone]  Problem List: 1. Coronary Artery disease- s/p 3.5 EVOLVE II study stent by 20 mm to the RCA 2. Diabetes Mellitus 3. Hyperlipidemia / hypertriglyceridemia   Previous notes:   Fernando Nelson is a 62 yo with a recent episode of CP. He was admitted with unstable angina. He was found to have a tight right coronary artery stenosis and had placement of a 3.5 mm drug-eluting stent to the RCA.  He has had twinges of chest pain that last only several seconds.  He is exercising 4 times a week.  Feb. 13, 2015:  Fernando Nelson is doing very well. He is exercising without any chest pains or shortness breath. He is enrolled in the EVOLVE  II stent study.  He works as a Sport and exercise psychologist  For [employer]  Dr. [name] is following his lipid levels.  Nov. 22, 2016  Fernando Nelson is seen today for follow up of his CAD Doing well.  No angina  Exercising  -  Has some right sided pain , constant , not similar to his presenting pain  Will talk to his primary medical doctor  Dec. 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    No angina.   Exercising , gym 3 days a week .     Has lost some weight.   Dec 27, 2019:;  Still working at home . Trying to exercise  Wt is 230.   has gained 21 lbs this year  Has not done anything this past year due to covid.  21 members of his family have passed away due to Covid this past year No CP, no dyspnea  Dec 24, 2020: Fernando Nelson is seen today for follow  Up of his CAD  Hyperlipidemia, DM Wt is 236 lbs.,  Had gained 50 + lbs, now has lost that excess weight   Has improved his diet,  now is participating with the [facility] healthy weight loss program.  Still working with [employer]  Is exercising ,  No angina   January 31, 2022 Fernando Nelson is seen for follow up of his CAD, HLD, DM Wt is 229 lbs.  Has lost 7 lbs from last year  Has occasional / rare chest twinges - last for 10 seconds  Works out 5 times a week. Is watching her diet   Labs done on November 23, 2021 at his primary medical doctor's office reveals a total cholesterol of 130.  HDL is 30.  LDL is 60.  Triglyceride level is 244.  Creatinine 0.85.  Potassium is 4.6.   February 21, 2023 Fernando Nelson is seen for follow up of his CAD, HLD, DM Wt is   Current Outpatient Medications on File Prior to Visit  Medication Sig Dispense Refill   aspirin EC 81 MG tablet Take 81 mg by mouth every morning.     atorvastatin (LIPITOR) 80 MG tablet Take 1 tablet (80 mg total) by mouth daily. 90 tablet 3   gabapentin (NEURONTIN) 100 MG capsule Take  1 capsule (100 mg total) by mouth 3 (three) times daily. 90 capsule 3   icosapent Ethyl (VASCEPA) 1 g capsule Take 2 capsules (2 g total) by mouth 2 (two) times daily. 270 capsule 3   insulin lispro (HUMALOG KWIKPEN) 200 UNIT/ML KwikPen Inject 20 Units into the skin daily. 20 to 36 units at meals, up to 60 units a day     metoprolol tartrate (LOPRESSOR) 25 MG tablet TAKE 1 TABLET BY MOUTH TWICE DAILY. 180 tablet 3   Multiple Vitamin (MULTIVITAMIN WITH MINERALS) TABS Take 1 tablet by mouth daily.     nitroGLYCERIN (NITROSTAT) 0.4 MG SL tablet Place 1 tablet (0.4 mg total) under the tongue every 5 (five) minutes x 3 doses as needed for chest pain. 25 tablet 6   ondansetron (ZOFRAN-ODT) 8 MG disintegrating tablet Take 1 tablet (8 mg total) by mouth every 8 (eight) hours as needed for nausea or vomiting. 20 tablet 0   Testosterone 20.25 MG/1.25GM (1.62%) GEL Place onto the skin.     TOUJEO SOLOSTAR 300 UNIT/ML Solostar Pen Inject 70 Units into the skin daily.     TRULICITY 1.5 MG/0.5ML SOPN SMARTSIG:0.5  Milliliter(s) SUB-Q Once a Week     valACYclovir (VALTREX) 500 MG tablet Take 500 mg by mouth 2 (two) times daily.     Vitamin D, Ergocalciferol, (DRISDOL) 1.25 MG (50000 UNIT) CAPS capsule TAKE 1 CAPSULE BY MOUTH EVERY 7 DAYS 4 capsule 0   No current facility-administered medications on file prior to visit.    No Known Allergies  Past Medical History:  Diagnosis Date   Always tired    Back pain    Bilateral swelling of feet and ankles    CAD (coronary artery disease)    a. [place] 02/2012 - s/p PTCA/Evolve II study stent to RCA 03/23/12, residual LAD dz   Chest pain    Erectile dysfunction    Hearing loss    Hepatitis B ~ 2000   "donated blood to [employer]; got letter; had shots over 6 months; gone now"   Hyperlipemia    Hypertension    Shortness of breath    Swallowing difficulty    Type II diabetes mellitus ([facility])     Past Surgical History:  Procedure Laterality Date   CORONARY ANGIOPLASTY WITH STENT PLACEMENT  03/23/12   "1; first one ever"   LEFT HEART CATHETERIZATION WITH CORONARY ANGIOGRAM N/A 03/23/2012   Procedure: LEFT HEART CATHETERIZATION WITH CORONARY ANGIOGRAM;  Surgeon: Dolores Patty, MD;  Location: [facility] C[facility] LAB;  Service: Cardiovascular;  Laterality: N/A;   PERCUTANEOUS CORONARY STENT INTERVENTION (PCI-S)  03/23/2012   Procedure: PERCUTANEOUS CORONARY STENT INTERVENTION (PCI-S);  Surgeon: Dolores Patty, MD;  Location: [facility] CATH LAB;  Service: Cardiovascular;;   SHOULDER OPEN ROTATOR CUFF REPAIR  2005   left   VASECTOMY      Social History   Tobacco Use  Smoking Status Former   Packs/day: 1.00   Years: 25.00   Additional pack years: 0.00   Total pack years: 25.00   Types: Cigarettes   Quit date: 09/22/2006   Years since quitting: 16.4  Smokeless Tobacco Never    Social History   Substance and Sexual Activity  Alcohol Use No    Family History  Problem Relation Age of Onset   Heart failure Mother    Diabetes Mother    Heart disease Mother     Sudden death Mother    Obesity Mother    Hypertension Father  Hyperlipidemia Father    Heart disease Father    Sudden death Father    Cancer Father    Obesity Father     Reviw of Systems:  Reviewed in the HPI.  All other systems are negative.    Physical Exam: There were no vitals taken for this visit.  No BP recorded.  {Refresh Note OR Click here to enter BP  :1}***    GEN:  Well nourished, well developed in no acute distress HEENT: Normal NECK: No JVD; No carotid bruits LYMPHATICS: No lymphadenopathy CARDIAC: RRR ***, no murmurs, rubs, gallops RESPIRATORY:  Clear to auscultation without rales, wheezing or rhonchi  ABDOMEN: Soft, non-tender, non-distended MUSCULOSKELETAL:  No edema; No deformity  SKIN: Warm and dry NEUROLOGIC:  Alert and oriented x 3    ECG:     Assessment / Plan:   1. Coronary Artery disease- s/p 3.5 EVOLVE II study stent by 20 mm to the RCA-    .    2. Diabetes Mellitus-     3. Hyperlipidemia / hypertriglyceridemia -       Kristeen Miss, MD  02/20/2023 8:06 PM    [facility] [address] [place],  [address] [place], [place]  [address] Pager [phone] Phone: [phone]; Fax: [phone]

## 2023-02-21 ENCOUNTER — Ambulatory Visit: Payer: BC Managed Care – PPO | Attending: Cardiovascular Disease | Admitting: Cardiovascular Disease

## 2023-02-21 ENCOUNTER — Encounter: Payer: Self-pay | Admitting: Cardiovascular Disease

## 2023-02-21 VITALS — BP 122/60 | HR 90 | Ht 67.0 in | Wt 254.4 lb

## 2023-02-21 DIAGNOSIS — I25119 Atherosclerotic heart disease of native coronary artery with unspecified angina pectoris: Secondary | ICD-10-CM

## 2023-02-21 DIAGNOSIS — E782 Mixed hyperlipidemia: Secondary | ICD-10-CM

## 2023-02-21 NOTE — Patient Instructions (Signed)
Medication Instructions:   Your physician recommends that you continue on your current medications as directed. Please refer to the Current Medication list given to you today.  *If you need a refill on your cardiac medications before your next appointment, please call your pharmacy*    Follow-Up: At [facility], you and your health needs are our priority.  As part of our continuing mission to provide you with exceptional heart care, we have created designated Provider Care Teams.  These Care Teams include your primary Cardiologist (physician) and Advanced Practice Providers (APPs -  Physician Assistants and Nurse Practitioners) who all work together to provide you with the care you need, when you need it.  We recommend signing up for the patient portal called "MyChart".  Sign up information is provided on this After Visit Summary.  MyChart is used to connect with patients for Virtual Visits (Telemedicine).  Patients are able to view lab/test results, encounter notes, upcoming appointments, etc.  Non-urgent messages can be sent to your provider as well.   To learn more about what you can do with MyChart, go to [email or web address].    Your next appointment:   1 year(s)  Provider:   Philip Nahser, MD       

## 2023-02-22 ENCOUNTER — Other Ambulatory Visit: Payer: Self-pay

## 2023-02-22 ENCOUNTER — Encounter (HOSPITAL_BASED_OUTPATIENT_CLINIC_OR_DEPARTMENT_OTHER): Payer: Self-pay | Admitting: Emergency Medicine

## 2023-02-22 ENCOUNTER — Inpatient Hospital Stay (HOSPITAL_BASED_OUTPATIENT_CLINIC_OR_DEPARTMENT_OTHER)
Admission: EM | Admit: 2023-02-22 | Discharge: 2023-02-26 | DRG: 177 | Disposition: A | Discharge status: Home / Self Care | Payer: BC Managed Care – PPO | Attending: Internal Medicine | Admitting: Internal Medicine

## 2023-02-22 ENCOUNTER — Emergency Department (HOSPITAL_BASED_OUTPATIENT_CLINIC_OR_DEPARTMENT_OTHER): Payer: BC Managed Care – PPO

## 2023-02-22 DIAGNOSIS — E1165 Type 2 diabetes mellitus with hyperglycemia: Secondary | ICD-10-CM | POA: Diagnosis not present

## 2023-02-22 DIAGNOSIS — I10 Essential (primary) hypertension: Secondary | ICD-10-CM | POA: Diagnosis present

## 2023-02-22 DIAGNOSIS — R918 Other nonspecific abnormal finding of lung field: Secondary | ICD-10-CM | POA: Diagnosis not present

## 2023-02-22 DIAGNOSIS — B1910 Unspecified viral hepatitis B without hepatic coma: Secondary | ICD-10-CM | POA: Diagnosis present

## 2023-02-22 DIAGNOSIS — Z79890 Hormone replacement therapy (postmenopausal): Secondary | ICD-10-CM

## 2023-02-22 DIAGNOSIS — R000 Tachycardia, unspecified: Secondary | ICD-10-CM | POA: Diagnosis not present

## 2023-02-22 DIAGNOSIS — J1282 Pneumonia due to coronavirus disease 2019: Secondary | ICD-10-CM | POA: Diagnosis not present

## 2023-02-22 DIAGNOSIS — E785 Hyperlipidemia, unspecified: Secondary | ICD-10-CM | POA: Diagnosis present

## 2023-02-22 DIAGNOSIS — H9190 Unspecified hearing loss, unspecified ear: Secondary | ICD-10-CM | POA: Diagnosis not present

## 2023-02-22 DIAGNOSIS — N529 Male erectile dysfunction, unspecified: Secondary | ICD-10-CM | POA: Diagnosis present

## 2023-02-22 DIAGNOSIS — Z955 Presence of coronary angioplasty implant and graft: Secondary | ICD-10-CM | POA: Diagnosis not present

## 2023-02-22 DIAGNOSIS — Z8249 Family history of ischemic heart disease and other diseases of the circulatory system: Secondary | ICD-10-CM

## 2023-02-22 DIAGNOSIS — Z794 Long term (current) use of insulin: Secondary | ICD-10-CM

## 2023-02-22 DIAGNOSIS — Z87891 Personal history of nicotine dependence: Secondary | ICD-10-CM

## 2023-02-22 DIAGNOSIS — Z7982 Long term (current) use of aspirin: Secondary | ICD-10-CM

## 2023-02-22 DIAGNOSIS — Z79899 Other long term (current) drug therapy: Secondary | ICD-10-CM | POA: Diagnosis not present

## 2023-02-22 DIAGNOSIS — I2510 Atherosclerotic heart disease of native coronary artery without angina pectoris: Secondary | ICD-10-CM | POA: Diagnosis not present

## 2023-02-22 DIAGNOSIS — U071 COVID-19: Secondary | ICD-10-CM | POA: Diagnosis not present

## 2023-02-22 DIAGNOSIS — Z83438 Family history of other disorder of lipoprotein metabolism and other lipidemia: Secondary | ICD-10-CM | POA: Diagnosis not present

## 2023-02-22 DIAGNOSIS — J90 Pleural effusion, not elsewhere classified: Secondary | ICD-10-CM | POA: Diagnosis not present

## 2023-02-22 DIAGNOSIS — R0902 Hypoxemia: Secondary | ICD-10-CM | POA: Diagnosis not present

## 2023-02-22 DIAGNOSIS — Z6839 Body mass index (BMI) 39.0-39.9, adult: Secondary | ICD-10-CM

## 2023-02-22 DIAGNOSIS — Z833 Family history of diabetes mellitus: Secondary | ICD-10-CM | POA: Diagnosis not present

## 2023-02-22 DIAGNOSIS — R0602 Shortness of breath: Secondary | ICD-10-CM | POA: Diagnosis not present

## 2023-02-22 LAB — BASIC METABOLIC PANEL
Anion gap: 8 (ref 5–15)
BUN: 15 mg/dL (ref 6–20)
CO2: 26 mmol/L (ref 22–32)
Calcium: 8.2 mg/dL — ABNORMAL LOW (ref 8.9–10.3)
Chloride: 98 mmol/L (ref 98–111)
Creatinine, Ser: 0.99 mg/dL (ref 0.61–1.24)
GFR, Estimated: >60 mL/min (ref >60)
Glucose, Bld: 154 mg/dL — ABNORMAL HIGH (ref 70–99)
Potassium: 4.2 mmol/L (ref 3.5–5.1)
Sodium: 132 mmol/L — ABNORMAL LOW (ref 135–145)

## 2023-02-22 LAB — RESP PANEL BY RT-PCR (RSV, FLU A&B, COVID)  RVPGX2
Influenza A by PCR: NEGATIVE
Influenza B by PCR: NEGATIVE
Resp Syncytial Virus by PCR: NEGATIVE
SARS Coronavirus 2 by RT PCR: POSITIVE — AB

## 2023-02-22 LAB — CBC
HCT: 41.8 % (ref 39.0–52.0)
Hemoglobin: 13.8 g/dL (ref 13.0–17.0)
MCH: 28.2 pg (ref 26.0–34.0)
MCHC: 33 g/dL (ref 30.0–36.0)
MCV: 85.3 fL (ref 80.0–100.0)
Platelets: 204 10*3/uL (ref 150–400)
RBC: 4.9 MIL/uL (ref 4.22–5.81)
RDW: 13.8 % (ref 11.5–15.5)
WBC: 8.3 10*3/uL (ref 4.0–10.5)
nRBC: 0 % (ref 0.0–0.2)

## 2023-02-22 LAB — TROPONIN I (HIGH SENSITIVITY)
Troponin I (High Sensitivity): 6 ng/L (ref <18)
Troponin I (High Sensitivity): 6 ng/L (ref <18)

## 2023-02-22 LAB — BRAIN NATRIURETIC PEPTIDE: B Natriuretic Peptide: 116.5 pg/mL — ABNORMAL HIGH (ref 0.0–100.0)

## 2023-02-22 LAB — CBG MONITORING, ED: Glucose-Capillary: 156 mg/dL — ABNORMAL HIGH (ref 70–99)

## 2023-02-22 MED ORDER — IOHEXOL 350 MG/ML SOLN: 100.0000 mL | Freq: Once | INTRAVENOUS | Status: DC | PRN

## 2023-02-22 MED ORDER — ACETAMINOPHEN 325 MG PO TABS
650.0000 mg | ORAL_TABLET | Freq: Once | ORAL | Status: AC
  Administered 2023-02-22: 650 mg via ORAL
  Filled 2023-02-22: qty 2

## 2023-02-22 MED ORDER — IOHEXOL 350 MG/ML SOLN
100.0000 mL | Freq: Once | INTRAVENOUS | Status: AC | PRN
  Administered 2023-02-22: 100 mL via INTRAVENOUS

## 2023-02-22 MED ORDER — ALBUTEROL SULFATE HFA 108 (90 BASE) MCG/ACT IN AERS: 2.0000 | INHALATION_SPRAY | RESPIRATORY_TRACT | Status: DC | PRN

## 2023-02-22 NOTE — ED Provider Notes (Signed)
[place] [facility] AT [facility] Provider Note   CSN: [id number] Arrival date & time: 02/22/23  1847     History  Chief Complaint  Patient presents with   Shortness of Breath    Fernando Nelson is a 62 y.o. male with medical history of type 2 diabetes not well-controlled, CAD, chest pain, hepatitis B, hypertension, hyperlipidemia.  The patient presents to the ED for evaluation of chest pain and shortness of breath.  The patient reports that he recently traveled down to [place] and had a cruise.  He reports he was gone for 5 days.  He states that he returned on Sunday in his usual state of health.  States that on Monday morning woke up, signed onto his workstation at home and did some work before feeling very tired.  He reports that he attributed his fatigue to his recent vacation.  He states that he went to bed this night, woke up on Tuesday with his cardiology appointment.  He reports that cardiology, he had oxygen saturation of 98% in triage and had unremarkable visit.  He states that immediately after leaving cardiology office he again felt very exhausted and fatigued which he attributed again to his recent vacation.  He states he woke up this morning on Wednesday and had chest pain described as a crushing sensation to the middle of his chest associated with shortness of breath.  He reports that he is unable to fully expand his lungs he feels.  He arrives as an oxygen saturation of 89% on room air.  He arrives with a temperature of 99.2 F.  He is unsure of sick contacts as he states he was recently cruise.  He denies any leg swelling beyond his baseline.  He is endorsing nausea with vomiting but denies any current nausea.  He denies abdominal pain but is endorsing abdominal bloating which she states is not abnormal for him.  He denies diarrhea, dysuria, flank pain.  He is also endorsing a cough along with sore throat and bodyaches and chills.  States on examination currently  he has no chest pain.   Shortness of Breath Associated symptoms: chest pain, cough, fever and sore throat   Associated symptoms: no vomiting        Home Medications Prior to Admission medications   Medication Sig Start Date End Date Taking? Authorizing Provider  aspirin EC 81 MG tablet Take 81 mg by mouth every morning.    [provider]  atorvastatin (LIPITOR) 80 MG tablet Take 1 tablet (80 mg total) by mouth daily. 03/19/21   Nahser, Deloris Ping, MD  gabapentin (NEURONTIN) 100 MG capsule Take 1 capsule (100 mg total) by mouth 3 (three) times daily. Patient not taking: Reported on 02/21/2023 10/10/22   Felecia Shelling, DPM  icosapent Ethyl (VASCEPA) 1 g capsule Take 2 capsules (2 g total) by mouth 2 (two) times daily. 05/26/22   Corky Crafts, MD  insulin lispro (HUMALOG KWIKPEN) 200 UNIT/ML KwikPen Inject 20 Units into the skin daily. 20 to 36 units at meals, up to 60 units a day    [provider]  metoprolol tartrate (LOPRESSOR) 25 MG tablet TAKE 1 TABLET BY MOUTH TWICE DAILY. 02/17/22   Nahser, Deloris Ping, MD  Multiple Vitamin (MULTIVITAMIN WITH MINERALS) TABS Take 1 tablet by mouth daily.    [provider]  nitroGLYCERIN (NITROSTAT) 0.4 MG SL tablet Place 1 tablet (0.4 mg total) under the tongue every 5 (five) minutes x 3  doses as needed for chest pain. 12/27/19   Nahser, Deloris Ping, MD  ondansetron (ZOFRAN-ODT) 8 MG disintegrating tablet Take 1 tablet (8 mg total) by mouth every 8 (eight) hours as needed for nausea or vomiting. Patient not taking: Reported on 02/21/2023 11/05/22   Wallis Bamberg, PA-C  Testosterone 20.25 MG/1.25GM (1.62%) GEL Place onto the skin. Patient not taking: Reported on 02/21/2023    [provider]  TOUJEO SOLOSTAR 300 UNIT/ML Solostar Pen Inject 70 Units into the skin daily. 12/02/19   [provider]  TRULICITY 1.5 MG/0.5ML SOPN SMARTSIG:0.5 Milliliter(s) SUB-Q Once a Week 05/23/22   [provider]  valACYclovir  (VALTREX) 500 MG tablet Take 500 mg by mouth 2 (two) times daily.    [provider]  Vitamin D, Ergocalciferol, (DRISDOL) 1.25 MG (50000 UNIT) CAPS capsule TAKE 1 CAPSULE BY MOUTH EVERY 7 DAYS Patient not taking: Reported on 02/21/2023 02/09/21   Whitmire, Thermon Leyland, FNP      Allergies    Patient has no known allergies.    Review of Systems   Review of Systems  Constitutional:  Positive for chills, fatigue and fever.  HENT:  Positive for sore throat.   Respiratory:  Positive for cough and shortness of breath.   Cardiovascular:  Positive for chest pain. Negative for leg swelling.  Gastrointestinal:  Negative for nausea and vomiting.  Musculoskeletal:  Positive for myalgias.  All other systems reviewed and are negative.   Physical Exam Updated Vital Signs BP (!) 128/56   Pulse 93   Temp 99.2 F (37.3 C) (Oral)   Resp 20   Ht 5\' 7"  (1.702 m)   Wt 115 kg   SpO2 99%   BMI 39.71 kg/m  Physical Exam Vitals and nursing note reviewed.  Constitutional:      General: He is not in acute distress.    Appearance: Normal appearance. He is not ill-appearing, toxic-appearing or diaphoretic.  HENT:     Head: Normocephalic and atraumatic.     Nose: Nose normal.     Mouth/Throat:     Mouth: Mucous membranes are moist.     Pharynx: Oropharynx is clear.  Eyes:     Extraocular Movements: Extraocular movements intact.     Conjunctiva/sclera: Conjunctivae normal.     Pupils: Pupils are equal, round, and reactive to light.  Cardiovascular:     Rate and Rhythm: Regular rhythm. Tachycardia present.  Pulmonary:     Effort: Pulmonary effort is normal.     Breath sounds: Normal breath sounds. No wheezing.  Abdominal:     General: Abdomen is flat. Bowel sounds are normal. There is distension.     Palpations: Abdomen is soft.     Tenderness: There is no abdominal tenderness.  Musculoskeletal:     Cervical back: Normal range of motion and neck supple.     Right lower leg: Edema present.      Left lower leg: Edema present.  Skin:    General: Skin is warm and dry.     Capillary Refill: Capillary refill takes less than 2 seconds.  Neurological:     Mental Status: He is alert and oriented to person, place, and time.     ED Results / Procedures / Treatments   Labs (all labs ordered are listed, but only abnormal results are displayed) Labs Reviewed  RESP PANEL BY RT-PCR (RSV, FLU A&B, COVID)  RVPGX2 - Abnormal; Notable for the following components:      Result Value  SARS Coronavirus 2 by RT PCR POSITIVE (*)    All other components within normal limits  BASIC METABOLIC PANEL - Abnormal; Notable for the following components:   Sodium 132 (*)    Glucose, Bld 154 (*)    Calcium 8.2 (*)    All other components within normal limits  BRAIN NATRIURETIC PEPTIDE - Abnormal; Notable for the following components:   B Natriuretic Peptide 116.5 (*)    All other components within normal limits  CBG MONITORING, ED - Abnormal; Notable for the following components:   Glucose-Capillary 156 (*)    All other components within normal limits  CBC  TROPONIN I (HIGH SENSITIVITY)  TROPONIN I (HIGH SENSITIVITY)    EKG None  Radiology CT Angio Chest PE W and/or Wo Contrast  Result Date: 02/22/2023 CLINICAL DATA:  Sore throat, cough, congestion, fever, COVID positive EXAM: CT ANGIOGRAPHY CHEST WITH CONTRAST TECHNIQUE: Multidetector CT imaging of the chest was performed using the standard protocol during bolus administration of intravenous contrast. Multiplanar CT image reconstructions and MIPs were obtained to evaluate the vascular anatomy. RADIATION DOSE REDUCTION: This exam was performed according to the departmental dose-optimization program which includes automated exposure control, adjustment of the mA and/or kV according to patient size and/or use of iterative reconstruction technique. CONTRAST:  OMNIPAQUE IOHEXOL 350 MG/ML SOLN COMPARISON:  CT chest dated 07/06/2022 FINDINGS:  Cardiovascular: Satisfactory opacification of the bilateral pulmonary arteries to the lobar level. Mild respiratory motion in the bilateral lower lobes. No evidence of pulmonary embolism. Although not tailored for evaluation of the thoracic aorta, there is no evidence of thoracic aortic aneurysm or dissection. Moderate coronary atherosclerosis of the LAD and right coronary artery. Mediastinum/Nodes: No suspicious mediastinal lymphadenopathy. Visualized thyroid is unremarkable. Lungs/Pleura: Small bilateral pleural effusions. Associated mild patchy bilateral lower lobe opacities (for example, series 5/image 48), with a subpleural/peripheral distribution, favoring mild COVID pneumonia in this clinical setting. 8 mm subpleural nodule in the left lower lobe (series 5/image 59), unchanged/chronic, benign. No follow-up is recommended. No pneumothorax. Upper Abdomen: Visualized upper abdomen is grossly unremarkable. Musculoskeletal: Very mild degenerative changes of the lower thoracic spine. Review of the MIP images confirms the above findings. IMPRESSION: No evidence of pulmonary embolism. Mild patchy bilateral lower lobe opacities, favoring mild COVID pneumonia in this clinical setting. Small bilateral pleural effusions. Electronically Signed   By: Charline Bills M.D.   On: 02/22/2023 21:20   DG Chest 2 View  Result Date: 02/22/2023 CLINICAL DATA:  Short of breath EXAM: CHEST - 2 VIEW COMPARISON:  06/03/2022, CT 07/06/2022 FINDINGS: No acute airspace disease or pleural effusion. Stable cardiomediastinal silhouette. No pneumothorax. IMPRESSION: No active cardiopulmonary disease. Electronically Signed   By: Jasmine Pang M.D.   On: 02/22/2023 19:56    Procedures .Critical Care  Performed by: Al Decant, PA-C Authorized by: Al Decant, PA-C   Critical care provider statement:    Critical care time (minutes):  75   Critical care time was exclusive of:  Separately billable procedures and  treating other patients   Critical care was necessary to treat or prevent imminent or life-threatening deterioration of the following conditions:  Respiratory failure   Critical care was time spent personally by me on the following activities:  Blood draw for specimens, ordering and performing treatments and interventions, ordering and review of laboratory studies, development of treatment plan with patient or surrogate, ordering and review of radiographic studies, pulse oximetry, re-evaluation of patient's condition, review of old charts, obtaining history  from patient or surrogate, interpretation of cardiac output measurements, examination of patient, evaluation of patient's response to treatment, discussions with primary provider and discussions with consultants   I assumed direction of critical care for this patient from another provider in my specialty: no      Medications Ordered in ED Medications  albuterol (VENTOLIN HFA) 108 (90 Base) MCG/ACT inhaler 2 puff (has no administration in time range)  iohexol (OMNIPAQUE) 350 MG/ML injection 100 mL (has no administration in time range)  acetaminophen (TYLENOL) tablet 650 mg (650 mg Oral Given 02/22/23 2029)  iohexol (OMNIPAQUE) 350 MG/ML injection 100 mL (100 mLs Intravenous Contrast Given 02/22/23 2106)    ED Course/ Medical Decision Making/ A&P    Medical Decision Making Amount and/or Complexity of Data Reviewed Labs: ordered. Radiology: ordered.  Risk OTC drugs. Prescription drug management.   62 year old male presents to ED for evaluation.  Please see HPI for further details.  On examination patient is tachycardic and febrile.  Patient lung sounds are clear bilaterally, he is currently hypoxic on room air.  Patient was placed on 2 L at this time and he is currently satting 90% on 2 L of oxygen via nasal cannula.  His abdomen is distended throughout.  No tenderness.  He does have 1+ nonpitting edema bilateral lower extremities.   Neurological examination at baseline.  CBC without leukocytosis or anemia.  The patient metabolic panel shows sodium 132, glucose 154, anion gap 8.  Patient point-of-care CBG 156.  Patient BNP elevated 116.5.  Patient viral panel shows COVID-positive.  Patient troponin 6, delta 6.  Chest x-ray unremarkable without evidence of consolidations or effusions.  CT angio shows no evidence of PE however there is scattered groundglass opacities to bilateral lobes.  Patient was likely has COVID-pneumonia.  Will defer on antibiotics at this time.  Have called for admission due to new oxygen requirement.  Discussed with Dr. Julian Reil who has agreed to admit the patient.  I have advised Dr. Julian Reil that we do not have Paxlovid here on the formulary at [facility].  Dr. Julian Reil has agreed to admit the patient.  Patient is amenable to plan.  Stable at time of admission.   Final Clinical Impression(s) / ED Diagnoses Final diagnoses:  COVID-19  Pneumonia due to COVID-19 virus  Hypoxia    Rx / DC Orders ED Discharge Orders     None         Clent Ridges 02/22/23 2241    Terald Sleeper, MD 02/22/23 2329

## 2023-02-22 NOTE — ED Triage Notes (Signed)
Pt states that he has a sore throat with congestion and cough, n/v, fever and chest wall pain, denies diarrhea; reports seeing Cards MD yesterday for yearly assessment and had an EKG; sats 88% in triage, respiratory called

## 2023-02-22 NOTE — ED Notes (Signed)
Patient transported to X-ray 

## 2023-02-22 NOTE — ED Notes (Signed)
ED TO INPATIENT HANDOFF REPORT  ED Nurse Name and Phone #: Vena Austria, RN   S Name/Age/Gender Fernando Nelson 62 y.o. male Room/Bed: MH02/MH02  Code Status   Code Status: Not on file  Home/SNF/Other Home Patient oriented to: self, place, time, and situation Is this baseline? Yes   Triage Complete: Triage complete  Chief Complaint COVID-19 [U07.1]  Triage Note Pt states that he has a sore throat with congestion and cough, n/v, fever and chest wall pain, denies diarrhea; reports seeing Cards MD yesterday for yearly assessment and had an EKG; sats 88% in triage, respiratory called   Allergies No Known Allergies  Level of Care/Admitting Diagnosis ED Disposition     ED Disposition  Admit   Condition  --   Comment  Hospital Area: [facility] [[id number]]  Level of Care: Telemetry [5]  Admit to tele based on following criteria: Other see comments  Comments: COVID+  Interfacility transfer: Yes  May place patient in observation at [facility] or [facility] if equivalent level of care is available:: Yes  Covid Evaluation: Confirmed COVID Positive  Diagnosis: COVID-19 [[id number]]  Admitting Physician: Arlean Hopping [[id number]]  Attending Physician: Arlean Hopping [[id number]]          B Medical/Surgery History Past Medical History:  Diagnosis Date   Always tired    Back pain    Bilateral swelling of feet and ankles    CAD (coronary artery disease)    a. [place] 02/2012 - s/p PTCA/Evolve II study stent to RCA 03/23/12, residual LAD dz   Chest pain    Erectile dysfunction    Hearing loss    Hepatitis B ~ 2000   "donated blood to [employer]; got letter; had shots over 6 months; gone now"   Hyperlipemia    Hypertension    Shortness of breath    Swallowing difficulty    Type II diabetes mellitus (HCC)    Past Surgical History:  Procedure Laterality Date   CORONARY ANGIOPLASTY WITH STENT PLACEMENT  03/23/12   "1; first one ever"   LEFT HEART  CATHETERIZATION WITH CORONARY ANGIOGRAM N/A 03/23/2012   Procedure: LEFT HEART CATHETERIZATION WITH CORONARY ANGIOGRAM;  Surgeon: Dolores Patty, MD;  Location[facility] C[facility] LAB;  Service: Cardiovascular;  Laterality: N/A;   PERCUTANEOUS CORONARY STENT INTERVENTION (PCI-S)  03/23/2012   Procedure: PERCUTANEOUS CORONARY STENT INTERVENTION (PCI-S);  Surgeon: Dolores Patty, MD;  Location: [facility] CATH LAB;  Service: Cardiovascular;;   SHOULDER OPEN ROTATOR CUFF REPAIR  2005   left   VASECTOMY       A IV Location/Drains/Wounds Patient Lines/Drains/Airways Status     Active Line/Drains/Airways     Name Placement date Placement time Site Days   Peripheral IV 02/22/23 20 G 1" Anterior;Distal;Right;Upper Arm 02/22/23  1957  Arm  less than 1            Intake/Output Last 24 hours No intake or output data in the 24 hours ending 02/22/23 2250  Labs/Imaging Results for orders placed or performed during the hospital encounter of 02/22/23 (from the past 48 hour(s))  Resp panel by RT-PCR (RSV, Flu A&B, Covid) Anterior Nasal Swab     Status: Abnormal   Collection Time: 02/22/23  7:38 PM   Specimen: Anterior Nasal Swab  Result Value Ref Range   SARS Coronavirus 2 by RT PCR POSITIVE (A) NEGATIVE    Comment: (NOTE) SARS-CoV-2 target nucleic acids are DETECTED.  The SARS-CoV-2 RNA is generally detectable in  upper respiratory specimens during the acute phase of infection. Positive results are indicative of the presence of the identified virus, but do not rule out bacterial infection or co-infection with other pathogens not detected by the test. Clinical correlation with patient history and other diagnostic information is necessary to determine patient infection status. The expected result is Negative.  Fact Sheet for Patients: BloggerCourse.com  Fact Sheet for Healthcare Providers: SeriousBroker.it  This test is not yet approved or cleared by  the [place] FDA and  has been authorized for detection and/or diagnosis of SARS-CoV-2 by FDA under an Emergency Use Authorization (EUA).  This EUA will remain in effect (meaning this test can be used) for the duration of  the COVID-19 declaration under Section 564(b)(1) of the A ct, 21 U.S.C. section 360bbb-3(b)(1), unless the authorization is terminated or revoked sooner.     Influenza A by PCR NEGATIVE NEGATIVE   Influenza B by PCR NEGATIVE NEGATIVE    Comment: (NOTE) The Xpert Xpress SARS-CoV-2/FLU/RSV plus assay is intended as an aid in the diagnosis of influenza from Nasopharyngeal swab specimens and should not be used as a sole basis for treatment. Nasal washings and aspirates are unacceptable for Xpert Xpress SARS-CoV-2/FLU/RSV testing.  Fact Sheet for Patients: BloggerCourse.com  Fact Sheet for Healthcare Providers: SeriousBroker.it  This test is not yet approved or cleared by the [place] FDA and has been authorized for detection and/or diagnosis of SARS-CoV-2 by FDA under an Emergency Use Authorization (EUA). This EUA will remain in effect (meaning this test can be used) for the duration of the COVID-19 declaration under Section 564(b)(1) of the Act, 21 U.S.C. section 360bbb-3(b)(1), unless the authorization is terminated or revoked.     Resp Syncytial Virus by PCR NEGATIVE NEGATIVE    Comment: (NOTE) Fact Sheet for Patients: BloggerCourse.com  Fact Sheet for Healthcare Providers: SeriousBroker.it  This test is not yet approved or cleared by the [place] FDA and has been authorized for detection and/or diagnosis of SARS-CoV-2 by FDA under an Emergency Use Authorization (EUA). This EUA will remain in effect (meaning this test can be used) for the duration of the COVID-19 declaration under Section 564(b)(1) of the Act, 21 U.S.C. section  360bbb-3(b)(1), unless the authorization is terminated or revoked.  Performed at [facility], [address] [place], [place] [address]   Basic metabolic panel     Status: Abnormal   Collection Time: 02/22/23  7:52 PM  Result Value Ref Range   Sodium 132 (L) 135 - 145 mmol/L   Potassium 4.2 3.5 - 5.1 mmol/L   Chloride 98 98 - 111 mmol/L   CO2 26 22 - 32 mmol/L   Glucose, Bld 154 (H) 70 - 99 mg/dL    Comment: Glucose reference range applies only to samples taken after fasting for at least 8 hours.   BUN 15 6 - 20 mg/dL   Creatinine, Ser 6.04 0.61 - 1.24 mg/dL   Calcium 8.2 (L) 8.9 - 10.3 mg/dL   GFR, Estimated >54 >09 mL/min    Comment: (NOTE) Calculated using the CKD-EPI Creatinine Equation (2021)    Anion gap 8 5 - 15    Comment: Performed at [facility], [address] [place], [place] [address]  CBC     Status: None   Collection Time: 02/22/23  7:52 PM  Result Value Ref Range   WBC 8.3 4.0 - 10.5 K/uL   RBC 4.90 4.22 - 5.81 MIL/uL  Hemoglobin 13.8 13.0 - 17.0 g/dL   HCT 16.1 09.6 - 04.5 %   MCV 85.3 80.0 - 100.0 fL   MCH 28.2 26.0 - 34.0 pg   MCHC 33.0 30.0 - 36.0 g/dL   RDW 40.9 81.1 - 91.4 %   Platelets 204 150 - 400 K/uL   nRBC 0.0 0.0 - 0.2 %    Comment: Performed at [facility], [address] [place], [place] [address]  Troponin I (High Sensitivity)     Status: None   Collection Time: 02/22/23  7:52 PM  Result Value Ref Range   Troponin I (High Sensitivity) 6 <18 ng/L    Comment: (NOTE) Elevated high sensitivity troponin I (hsTnI) values and significant  changes across serial measurements may suggest ACS but many other  chronic and acute conditions are known to elevate hsTnI results.  Refer to the "Links" section for chest pain algorithms and additional  guidance. Performed at [facility], [address], [place], [place] [address]   Brain natriuretic peptide     Status: Abnormal   Collection Time:  02/22/23  8:10 PM  Result Value Ref Range   B Natriuretic Peptide 116.5 (H) 0.0 - 100.0 pg/mL    Comment: Performed at [facility], [address] [place], [place] [address]  POC CBG, ED     Status: Abnormal   Collection Time: 02/22/23  8:32 PM  Result Value Ref Range   Glucose-Capillary 156 (H) 70 - 99 mg/dL    Comment: Glucose reference range applies only to samples taken after fasting for at least 8 hours.  Troponin I (High Sensitivity)     Status: None   Collection Time: 02/22/23  9:41 PM  Result Value Ref Range   Troponin I (High Sensitivity) 6 <18 ng/L    Comment: (NOTE) Elevated high sensitivity troponin I (hsTnI) values and significant  changes across serial measurements may suggest ACS but many other  chronic and acute conditions are known to elevate hsTnI results.  Refer to the "Links" section for chest pain algorithms and additional  guidance. Performed at [facility], [address] [place], [place] [address]    CT Angio Chest PE W and/or Wo Contrast  Result Date: 02/22/2023 CLINICAL DATA:  Sore throat, cough, congestion, fever, COVID positive EXAM: CT ANGIOGRAPHY CHEST WITH CONTRAST TECHNIQUE: Multidetector CT imaging of the chest was performed using the standard protocol during bolus administration of intravenous contrast. Multiplanar CT image reconstructions and MIPs were obtained to evaluate the vascular anatomy. RADIATION DOSE REDUCTION: This exam was performed according to the departmental dose-optimization program which includes automated exposure control, adjustment of the mA and/or kV according to patient size and/or use of iterative reconstruction technique. CONTRAST:  OMNIPAQUE IOHEXOL 350 MG/ML SOLN COMPARISON:  CT chest dated 07/06/2022 FINDINGS: Cardiovascular: Satisfactory opacification of the bilateral pulmonary arteries to the lobar level. Mild respiratory motion in the bilateral lower lobes. No evidence of pulmonary embolism.  Although not tailored for evaluation of the thoracic aorta, there is no evidence of thoracic aortic aneurysm or dissection. Moderate coronary atherosclerosis of the LAD and right coronary artery. Mediastinum/Nodes: No suspicious mediastinal lymphadenopathy. Visualized thyroid is unremarkable. Lungs/Pleura: Small bilateral pleural effusions. Associated mild patchy bilateral lower lobe opacities (for example, series 5/image 48), with a subpleural/peripheral distribution, favoring mild COVID pneumonia in this clinical setting. 8 mm subpleural nodule in the left lower lobe (series 5/image 59), unchanged/chronic, benign. No follow-up  is recommended. No pneumothorax. Upper Abdomen: Visualized upper abdomen is grossly unremarkable. Musculoskeletal: Very mild degenerative changes of the lower thoracic spine. Review of the MIP images confirms the above findings. IMPRESSION: No evidence of pulmonary embolism. Mild patchy bilateral lower lobe opacities, favoring mild COVID pneumonia in this clinical setting. Small bilateral pleural effusions. Electronically Signed   By: Charline Bills M.D.   On: 02/22/2023 21:20   DG Chest 2 View  Result Date: 02/22/2023 CLINICAL DATA:  Short of breath EXAM: CHEST - 2 VIEW COMPARISON:  06/03/2022, CT 07/06/2022 FINDINGS: No acute airspace disease or pleural effusion. Stable cardiomediastinal silhouette. No pneumothorax. IMPRESSION: No active cardiopulmonary disease. Electronically Signed   By: Jasmine Pang M.D.   On: 02/22/2023 19:56    Pending Labs Unresulted Labs (From admission, onward)    None       Vitals/Pain Today's Vitals   02/22/23 2015 02/22/23 2030 02/22/23 2045 02/22/23 2127  BP: (!) 126/56 133/66 (!) 128/56   Pulse: 96 100 93   Resp: 10 (!) 25 20   Temp:      TempSrc:      SpO2: 97% 93% 99%   Weight:      Height:      PainSc:    0-No pain    Isolation Precautions No active isolations  Medications Medications  albuterol (VENTOLIN HFA) 108 (90  Base) MCG/ACT inhaler 2 puff (has no administration in time range)  iohexol (OMNIPAQUE) 350 MG/ML injection 100 mL (has no administration in time range)  acetaminophen (TYLENOL) tablet 650 mg (650 mg Oral Given 02/22/23 2029)  iohexol (OMNIPAQUE) 350 MG/ML injection 100 mL (100 mLs Intravenous Contrast Given 02/22/23 2106)    Mobility walks     Focused Assessments Cardiac Assessment Handoff:    Lab Results  Component Value Date   CKTOTAL 42 03/24/2012   CKMB 1.3 03/24/2012   TROPONINI <0.30 03/23/2012   No results found for: "DDIMER" Does the Patient currently have chest pain? No   , Neuro Assessment Handoff:  Swallow screen pass? Yes          Neuro Assessment: Within Defined Limits Neuro Checks:      Has TPA been given? No If patient is a Neuro Trauma and patient is going to OR before floor call report to 4N Charge nurse: [phone] or [phone]  , Pulmonary Assessment Handoff:  Lung sounds:   O2 Device: Nasal Cannula O2 Flow Rate (L/min): 3 L/min    R Recommendations: See Admitting Provider Note  Report given to:   Additional Notes: Patient is COVID positive

## 2023-02-22 NOTE — ED Notes (Cosign Needed)
Patient transported to X-ray 

## 2023-02-22 NOTE — ED Notes (Signed)
[facility] called @22 :46

## 2023-02-23 DIAGNOSIS — E1165 Type 2 diabetes mellitus with hyperglycemia: Secondary | ICD-10-CM | POA: Diagnosis present

## 2023-02-23 DIAGNOSIS — U071 COVID-19: Secondary | ICD-10-CM

## 2023-02-23 DIAGNOSIS — I10 Essential (primary) hypertension: Secondary | ICD-10-CM | POA: Diagnosis present

## 2023-02-23 DIAGNOSIS — E785 Hyperlipidemia, unspecified: Secondary | ICD-10-CM | POA: Diagnosis present

## 2023-02-23 DIAGNOSIS — Z7982 Long term (current) use of aspirin: Secondary | ICD-10-CM | POA: Diagnosis not present

## 2023-02-23 DIAGNOSIS — Z87891 Personal history of nicotine dependence: Secondary | ICD-10-CM | POA: Diagnosis not present

## 2023-02-23 DIAGNOSIS — Z79890 Hormone replacement therapy (postmenopausal): Secondary | ICD-10-CM | POA: Diagnosis not present

## 2023-02-23 DIAGNOSIS — Z794 Long term (current) use of insulin: Secondary | ICD-10-CM | POA: Diagnosis not present

## 2023-02-23 DIAGNOSIS — Z83438 Family history of other disorder of lipoprotein metabolism and other lipidemia: Secondary | ICD-10-CM | POA: Diagnosis not present

## 2023-02-23 DIAGNOSIS — Z79899 Other long term (current) drug therapy: Secondary | ICD-10-CM | POA: Diagnosis not present

## 2023-02-23 DIAGNOSIS — Z955 Presence of coronary angioplasty implant and graft: Secondary | ICD-10-CM | POA: Diagnosis not present

## 2023-02-23 DIAGNOSIS — Z8249 Family history of ischemic heart disease and other diseases of the circulatory system: Secondary | ICD-10-CM | POA: Diagnosis not present

## 2023-02-23 DIAGNOSIS — N529 Male erectile dysfunction, unspecified: Secondary | ICD-10-CM | POA: Diagnosis present

## 2023-02-23 DIAGNOSIS — Z833 Family history of diabetes mellitus: Secondary | ICD-10-CM | POA: Diagnosis not present

## 2023-02-23 DIAGNOSIS — B1910 Unspecified viral hepatitis B without hepatic coma: Secondary | ICD-10-CM | POA: Diagnosis present

## 2023-02-23 DIAGNOSIS — H9190 Unspecified hearing loss, unspecified ear: Secondary | ICD-10-CM | POA: Diagnosis present

## 2023-02-23 DIAGNOSIS — I2510 Atherosclerotic heart disease of native coronary artery without angina pectoris: Secondary | ICD-10-CM | POA: Diagnosis present

## 2023-02-23 DIAGNOSIS — R0902 Hypoxemia: Secondary | ICD-10-CM | POA: Diagnosis present

## 2023-02-23 DIAGNOSIS — J1282 Pneumonia due to coronavirus disease 2019: Secondary | ICD-10-CM | POA: Diagnosis present

## 2023-02-23 DIAGNOSIS — Z6839 Body mass index (BMI) 39.0-39.9, adult: Secondary | ICD-10-CM | POA: Diagnosis not present

## 2023-02-23 LAB — CBC
HCT: 43 % (ref 39.0–52.0)
Hemoglobin: 13.7 g/dL (ref 13.0–17.0)
MCH: 28.3 pg (ref 26.0–34.0)
MCHC: 31.9 g/dL (ref 30.0–36.0)
MCV: 88.8 fL (ref 80.0–100.0)
Platelets: 192 10*3/uL (ref 150–400)
RBC: 4.84 MIL/uL (ref 4.22–5.81)
RDW: 13.9 % (ref 11.5–15.5)
WBC: 7.1 10*3/uL (ref 4.0–10.5)
nRBC: 0 % (ref 0.0–0.2)

## 2023-02-23 LAB — GLUCOSE, CAPILLARY
Glucose-Capillary: 203 mg/dL — ABNORMAL HIGH (ref 70–99)
Glucose-Capillary: 265 mg/dL — ABNORMAL HIGH (ref 70–99)
Glucose-Capillary: 333 mg/dL — ABNORMAL HIGH (ref 70–99)
Glucose-Capillary: 370 mg/dL — ABNORMAL HIGH (ref 70–99)
Glucose-Capillary: 289 mg/dL — ABNORMAL HIGH (ref 70–99)

## 2023-02-23 LAB — PHOSPHORUS: Phosphorus: 3.7 mg/dL (ref 2.5–4.6)

## 2023-02-23 LAB — COMPREHENSIVE METABOLIC PANEL
ALT: 23 U/L (ref 0–44)
AST: 20 U/L (ref 15–41)
Albumin: 2.9 g/dL — ABNORMAL LOW (ref 3.5–5.0)
Alkaline Phosphatase: 92 U/L (ref 38–126)
Anion gap: 10 (ref 5–15)
BUN: 16 mg/dL (ref 6–20)
CO2: 29 mmol/L (ref 22–32)
Calcium: 8.6 mg/dL — ABNORMAL LOW (ref 8.9–10.3)
Chloride: 100 mmol/L (ref 98–111)
Creatinine, Ser: 1.02 mg/dL (ref 0.61–1.24)
GFR, Estimated: >60 mL/min (ref >60)
Glucose, Bld: 179 mg/dL — ABNORMAL HIGH (ref 70–99)
Potassium: 4.3 mmol/L (ref 3.5–5.1)
Sodium: 139 mmol/L (ref 135–145)
Total Bilirubin: 0.5 mg/dL (ref 0.3–1.2)
Total Protein: 6.9 g/dL (ref 6.5–8.1)

## 2023-02-23 LAB — HEMOGLOBIN A1C
Hgb A1c MFr Bld: 9.9 % — ABNORMAL HIGH (ref 4.8–5.6)
Mean Plasma Glucose: 237.43 mg/dL

## 2023-02-23 LAB — MAGNESIUM: Magnesium: 1.9 mg/dL (ref 1.7–2.4)

## 2023-02-23 LAB — HIV ANTIBODY (ROUTINE TESTING W REFLEX): HIV Screen 4th Generation wRfx: NONREACTIVE

## 2023-02-23 MED ORDER — IPRATROPIUM-ALBUTEROL 0.5-2.5 (3) MG/3ML IN SOLN
3.0000 mL | Freq: Three times a day (TID) | RESPIRATORY_TRACT | Status: DC
  Administered 2023-02-23: 3 mL via RESPIRATORY_TRACT
  Filled 2023-02-23: qty 3

## 2023-02-23 MED ORDER — FOLIC ACID 1 MG PO TABS
1.0000 mg | ORAL_TABLET | Freq: Every day | ORAL | Status: DC
  Administered 2023-02-23 – 2023-02-26 (×4): 1 mg via ORAL
  Filled 2023-02-23 (×4): qty 1

## 2023-02-23 MED ORDER — ACETAMINOPHEN 325 MG PO TABS: 650.0000 mg | ORAL_TABLET | Freq: Four times a day (QID) | ORAL | Status: DC | PRN

## 2023-02-23 MED ORDER — PREDNISONE 50 MG PO TABS: 50.0000 mg | ORAL_TABLET | Freq: Every day | ORAL | Status: DC

## 2023-02-23 MED ORDER — INSULIN ASPART 100 UNIT/ML IJ SOLN
0.0000 [IU] | Freq: Every day | INTRAMUSCULAR | Status: DC
  Administered 2023-02-23 – 2023-02-24 (×2): 3 [IU] via SUBCUTANEOUS
  Administered 2023-02-25: 2 [IU] via SUBCUTANEOUS

## 2023-02-23 MED ORDER — METHYLPREDNISOLONE SODIUM SUCC 40 MG IJ SOLR
40.0000 mg | Freq: Every day | INTRAMUSCULAR | Status: DC
  Administered 2023-02-24 – 2023-02-26 (×3): 40 mg via INTRAVENOUS
  Filled 2023-02-23 (×3): qty 1

## 2023-02-23 MED ORDER — POLYETHYLENE GLYCOL 3350 17 G PO PACK: 17.0000 g | PACK | Freq: Every day | ORAL | Status: DC | PRN

## 2023-02-23 MED ORDER — THIAMINE MONONITRATE 100 MG PO TABS
100.0000 mg | ORAL_TABLET | Freq: Every day | ORAL | Status: DC
  Administered 2023-02-23 – 2023-02-26 (×4): 100 mg via ORAL
  Filled 2023-02-23 (×4): qty 1

## 2023-02-23 MED ORDER — ASPIRIN 81 MG PO TBEC
81.0000 mg | DELAYED_RELEASE_TABLET | Freq: Every morning | ORAL | Status: DC
  Administered 2023-02-23 – 2023-02-26 (×4): 81 mg via ORAL
  Filled 2023-02-23 (×4): qty 1

## 2023-02-23 MED ORDER — ALBUTEROL SULFATE (2.5 MG/3ML) 0.083% IN NEBU: 2.5000 mg | INHALATION_SOLUTION | RESPIRATORY_TRACT | Status: DC | PRN

## 2023-02-23 MED ORDER — SODIUM CHLORIDE 0.9 % IV SOLN
100.0000 mg | Freq: Every day | INTRAVENOUS | Status: AC
  Administered 2023-02-24 – 2023-02-25 (×2): 100 mg via INTRAVENOUS
  Filled 2023-02-23 (×3): qty 20

## 2023-02-23 MED ORDER — INSULIN GLARGINE-YFGN 100 UNIT/ML ~~LOC~~ SOLN
10.0000 [IU] | Freq: Every day | SUBCUTANEOUS | Status: DC
  Administered 2023-02-23: 10 [IU] via SUBCUTANEOUS
  Filled 2023-02-23 (×2): qty 0.1

## 2023-02-23 MED ORDER — SODIUM CHLORIDE 0.9 % IV SOLN
200.0000 mg | Freq: Once | INTRAVENOUS | Status: AC
  Administered 2023-02-23: 200 mg via INTRAVENOUS
  Filled 2023-02-23: qty 40

## 2023-02-23 MED ORDER — IPRATROPIUM-ALBUTEROL 0.5-2.5 (3) MG/3ML IN SOLN
3.0000 mL | Freq: Two times a day (BID) | RESPIRATORY_TRACT | Status: DC
  Administered 2023-02-23 – 2023-02-26 (×6): 3 mL via RESPIRATORY_TRACT
  Filled 2023-02-23 (×6): qty 3

## 2023-02-23 MED ORDER — PROCHLORPERAZINE EDISYLATE 10 MG/2ML IJ SOLN: 5.0000 mg | Freq: Four times a day (QID) | INTRAMUSCULAR | Status: DC | PRN

## 2023-02-23 MED ORDER — METHYLPREDNISOLONE SODIUM SUCC 125 MG IJ SOLR
0.5000 mg/kg | Freq: Two times a day (BID) | INTRAMUSCULAR | Status: DC
  Administered 2023-02-23 (×2): 57.5 mg via INTRAVENOUS
  Filled 2023-02-23 (×2): qty 2

## 2023-02-23 MED ORDER — BENZONATATE 100 MG PO CAPS
200.0000 mg | ORAL_CAPSULE | Freq: Three times a day (TID) | ORAL | Status: AC
  Administered 2023-02-23 – 2023-02-25 (×9): 200 mg via ORAL
  Filled 2023-02-23 (×10): qty 2

## 2023-02-23 MED ORDER — FUROSEMIDE 10 MG/ML IJ SOLN
20.0000 mg | INTRAMUSCULAR | Status: AC
  Administered 2023-02-23: 20 mg via INTRAVENOUS
  Filled 2023-02-23: qty 2

## 2023-02-23 MED ORDER — ATORVASTATIN CALCIUM 40 MG PO TABS
80.0000 mg | ORAL_TABLET | Freq: Every day | ORAL | Status: DC
  Administered 2023-02-23 – 2023-02-26 (×4): 80 mg via ORAL
  Filled 2023-02-23 (×4): qty 2

## 2023-02-23 MED ORDER — ENOXAPARIN SODIUM 60 MG/0.6ML IJ SOSY
55.0000 mg | PREFILLED_SYRINGE | INTRAMUSCULAR | Status: DC
  Administered 2023-02-23 – 2023-02-26 (×4): 55 mg via SUBCUTANEOUS
  Filled 2023-02-23 (×4): qty 0.6

## 2023-02-23 MED ORDER — INSULIN ASPART 100 UNIT/ML IJ SOLN
0.0000 [IU] | Freq: Three times a day (TID) | INTRAMUSCULAR | Status: DC
  Administered 2023-02-23: 20 [IU] via SUBCUTANEOUS
  Administered 2023-02-23: 11 [IU] via SUBCUTANEOUS
  Administered 2023-02-23 – 2023-02-24 (×2): 15 [IU] via SUBCUTANEOUS
  Administered 2023-02-24 (×2): 11 [IU] via SUBCUTANEOUS
  Administered 2023-02-25: 4 [IU] via SUBCUTANEOUS
  Administered 2023-02-25: 7 [IU] via SUBCUTANEOUS
  Administered 2023-02-25: 11 [IU] via SUBCUTANEOUS
  Administered 2023-02-26: 4 [IU] via SUBCUTANEOUS

## 2023-02-23 MED ORDER — ADULT MULTIVITAMIN W/MINERALS CH
1.0000 | ORAL_TABLET | Freq: Every day | ORAL | Status: DC
  Administered 2023-02-23 – 2023-02-26 (×4): 1 via ORAL
  Filled 2023-02-23 (×4): qty 1

## 2023-02-23 MED ORDER — IPRATROPIUM-ALBUTEROL 0.5-2.5 (3) MG/3ML IN SOLN
3.0000 mL | Freq: Four times a day (QID) | RESPIRATORY_TRACT | Status: DC
  Administered 2023-02-23: 3 mL via RESPIRATORY_TRACT
  Filled 2023-02-23: qty 3

## 2023-02-23 MED ORDER — MELATONIN 5 MG PO TABS
5.0000 mg | ORAL_TABLET | Freq: Every evening | ORAL | Status: DC | PRN
  Administered 2023-02-24 – 2023-02-25 (×2): 5 mg via ORAL
  Filled 2023-02-23 (×2): qty 1

## 2023-02-23 NOTE — TOC CAGE-AID Note (Signed)
Transition of Care [facility]) - CAGE-AID Screening   Patient Details  Name: Fernando Nelson MRN: [id number] Date of Birth: 09-03-1960  Transition of Care [facility]) CM/SW Contact:    Harriett Sine, RN Phone Number: 02/23/2023, 12:47 PM   Clinical Narrative: Transition of Care [facility]) - Inpatient Brief Assessment   Patient Details  Name: Fernando Nelson MRN: [id number] Date of Birth: [birth year]  Transition of Care [facility]) CM/SW Contact:    Harriett Sine, RN Phone Number: 02/23/2023, 12:52 PM   Clinical Narrative: Brief assessment complete, F/U O2 therapy.   Transition of Care Asessment: Insurance and Status: Insurance coverage has been reviewed Patient has primary care physician: Yes Home environment has been reviewed: from home Prior level of function:: independent Prior/Current Home Services: No current home services Social Determinants of Health Reivew: SDOH reviewed no interventions necessary Readmission risk has been reviewed: Yes Transition of care needs: no transition of care needs at this time (F/U O2 therapy)   CAGE-AID Screening:

## 2023-02-23 NOTE — H&P (Addendum)
History and Physical  Fernando Nelson:[id number] DOB: 07-09-1961 DOA: 02/22/2023  Referring physician: Accepted by Dr. Julian Reil [facility], hospitalist service. PCP: Noberto Retort, MD  Outpatient Specialists: Cardiology Patient coming from: Home.  Chief Complaint: Shortness of breath  HPI: Fernando Nelson is a 62 y.o. male with medical history significant for obesity, type 2 diabetes, coronary artery disease, hypertension, hyperlipidemia, who presented with complaints of shortness of breath with onset 2 days ago and progressively worsening.  Associated with a nonproductive cough.  Also endorses some discomfort in his chest when he takes a deep breath and some nasal congestion.  He initially presented to [facility] ED for further evaluation.  Reportedly, recently returned from a trip to [place], on Sunday 02/19/23, in his usual state of health.  In the ED, COVID-19 screening test returned positive.    CT angio chest was negative for pulmonary embolism however revealed the following findings:  Mild patchy bilateral lower lobe opacities, favoring mild COVID pneumonia in this clinical setting. Small bilateral pleural effusions.  His lab studies were notable for mildly elevated BNP 116.  High-sensitivity troponin were negative 6, repeat 6.  The patient was noted to be hypoxic on room air with O2 saturation of 87 to 88%.  Was placed on 3 L.  EDP requested admission for further management.  Was admitted by Dr. Julian Reil, [facility], hospitalist service and transferred to [facility] telemetry unit as observation status.  At the time of this visit the patient is feeling a lot better after breathing treatment.  He consented to receive antivirals.  IV Solu-Medrol and IV remdesivir were initiated.  P.o. Paxlovid interacts with his cardiac medications.  ED Course: Temperature 97.9.  BP 143/74, pulse 98, respiration rate 20, O2 saturation 98% on 3 L.  CBC unremarkable.  Serum sodium 132, serum glucose 154,  GFR greater than 60, BNP 116.  Review of Systems: Review of systems as noted in the HPI. All other systems reviewed and are negative.   Past Medical History:  Diagnosis Date   Always tired    Back pain    Bilateral swelling of feet and ankles    CAD (coronary artery disease)    a. [place] 02/2012 - s/p PTCA/Evolve II study stent to RCA 03/23/12, residual LAD dz   Chest pain    Erectile dysfunction    Hearing loss    Hepatitis B ~ 2000   "donated blood to [employer]; got letter; had shots over 6 months; gone now"   Hyperlipemia    Hypertension    Shortness of breath    Swallowing difficulty    Type II diabetes mellitus (HCC)    Past Surgical History:  Procedure Laterality Date   CORONARY ANGIOPLASTY WITH STENT PLACEMENT  03/23/12   "1; first one ever"   LEFT HEART CATHETERIZATION WITH CORONARY ANGIOGRAM N/A 03/23/2012   Procedure: LEFT HEART CATHETERIZATION WITH CORONARY ANGIOGRAM;  Surgeon: Dolores Patty, MD;  Location: [facility] CATH LAB;  Service: Cardiovascular;  Laterality: N/A;   PERCUTANEOUS CORONARY STENT INTERVENTION (PCI-S)  03/23/2012   Procedure: PERCUTANEOUS CORONARY STENT INTERVENTION (PCI-S);  Surgeon: Dolores Patty, MD;  Location: [facility] CATH LAB;  Service: Cardiovascular;;   SHOULDER OPEN ROTATOR CUFF REPAIR  2005   left   VASECTOMY      Social History:  reports that he quit smoking about 16 years ago. His smoking use included cigarettes. He has a 25.00 pack-year smoking history. He has never used smokeless tobacco. He reports that  he does not drink alcohol and does not use drugs.   No Known Allergies  Family History  Problem Relation Age of Onset   Heart failure Mother    Diabetes Mother    Heart disease Mother    Sudden death Mother    Obesity Mother    Hypertension Father    Hyperlipidemia Father    Heart disease Father    Sudden death Father    Cancer Father    Obesity Father       Prior to Admission medications   Medication Sig Start Date End Date  Taking? Authorizing Provider  aspirin EC 81 MG tablet Take 81 mg by mouth every morning.    [provider]  atorvastatin (LIPITOR) 80 MG tablet Take 1 tablet (80 mg total) by mouth daily. 03/19/21   Nahser, Deloris Ping, MD  gabapentin (NEURONTIN) 100 MG capsule Take 1 capsule (100 mg total) by mouth 3 (three) times daily. Patient not taking: Reported on 02/21/2023 10/10/22   Felecia Shelling, DPM  icosapent Ethyl (VASCEPA) 1 g capsule Take 2 capsules (2 g total) by mouth 2 (two) times daily. 05/26/22   Corky Crafts, MD  insulin lispro (HUMALOG KWIKPEN) 200 UNIT/ML KwikPen Inject 20 Units into the skin daily. 20 to 36 units at meals, up to 60 units a day    [provider]  metoprolol tartrate (LOPRESSOR) 25 MG tablet TAKE 1 TABLET BY MOUTH TWICE DAILY. 02/17/22   Nahser, Deloris Ping, MD  Multiple Vitamin (MULTIVITAMIN WITH MINERALS) TABS Take 1 tablet by mouth daily.    [provider]  nitroGLYCERIN (NITROSTAT) 0.4 MG SL tablet Place 1 tablet (0.4 mg total) under the tongue every 5 (five) minutes x 3 doses as needed for chest pain. 12/27/19   Nahser, Deloris Ping, MD  ondansetron (ZOFRAN-ODT) 8 MG disintegrating tablet Take 1 tablet (8 mg total) by mouth every 8 (eight) hours as needed for nausea or vomiting. Patient not taking: Reported on 02/21/2023 11/05/22   Wallis Bamberg, PA-C  Testosterone 20.25 MG/1.25GM (1.62%) GEL Place onto the skin. Patient not taking: Reported on 02/21/2023    [provider]  TOUJEO SOLOSTAR 300 UNIT/ML Solostar Pen Inject 70 Units into the skin daily. 12/02/19   [provider]  TRULICITY 1.5 MG/0.5ML SOPN SMARTSIG:0.5 Milliliter(s) SUB-Q Once a Week 05/23/22   [provider]  valACYclovir (VALTREX) 500 MG tablet Take 500 mg by mouth 2 (two) times daily.    [provider]  Vitamin D, Ergocalciferol, (DRISDOL) 1.25 MG (50000 UNIT) CAPS capsule TAKE 1 CAPSULE BY MOUTH EVERY 7 DAYS Patient not taking: Reported on 02/21/2023  02/09/21   Whitmire, Thermon Leyland, FNP    Physical Exam: BP (!) 143/74 (BP Location: Right Arm)   Pulse 98   Temp 97.9 F (36.6 C) (Oral)   Resp 20   Ht 5\' 7"  (1.702 m)   Wt 115 kg   SpO2 98%   BMI 39.71 kg/m   General: 62 y.o. year-old male well developed well nourished in no acute distress.  Alert and oriented x3. Cardiovascular: Regular rate and rhythm with no rubs or gallops.  No thyromegaly or JVD noted.  Trace lower extremity edema. 2/4 pulses in all 4 extremities. Respiratory: Mild rales at bases.  Poor inspiratory effort. Abdomen: Soft nontender nondistended with normal bowel sounds x4 quadrants. Muskuloskeletal: No cyanosis or clubbing noted bilaterally Neuro: CN II-XII intact, strength, sensation, reflexes Skin: No ulcerative lesions noted or rashes Psychiatry: Judgement and  insight appear normal. Mood is appropriate for condition and setting          Labs on Admission:  Basic Metabolic Panel: Recent Labs  Lab 02/22/23 1952  NA 132*  K 4.2  CL 98  CO2 26  GLUCOSE 154*  BUN 15  CREATININE 0.99  CALCIUM 8.2*   Liver Function Tests: No results for input(s): "AST", "ALT", "ALKPHOS", "BILITOT", "PROT", "ALBUMIN" in the last 168 hours. No results for input(s): "LIPASE", "AMYLASE" in the last 168 hours. No results for input(s): "AMMONIA" in the last 168 hours. CBC: Recent Labs  Lab 02/22/23 1952  WBC 8.3  HGB 13.8  HCT 41.8  MCV 85.3  PLT 204   Cardiac Enzymes: No results for input(s): "CKTOTAL", "CKMB", "CKMBINDEX", "TROPONINI" in the last 168 hours.  BNP (last 3 results) Recent Labs    02/22/23 2010  BNP 116.5*    ProBNP (last 3 results) No results for input(s): "PROBNP" in the last 8760 hours.  CBG: Recent Labs  Lab 02/22/23 2032 02/23/23 0117  GLUCAP 156* 203*    Radiological Exams on Admission: CT Angio Chest PE W and/or Wo Contrast  Result Date: 02/22/2023 CLINICAL DATA:  Sore throat, cough, congestion, fever, COVID positive EXAM: CT  ANGIOGRAPHY CHEST WITH CONTRAST TECHNIQUE: Multidetector CT imaging of the chest was performed using the standard protocol during bolus administration of intravenous contrast. Multiplanar CT image reconstructions and MIPs were obtained to evaluate the vascular anatomy. RADIATION DOSE REDUCTION: This exam was performed according to the departmental dose-optimization program which includes automated exposure control, adjustment of the mA and/or kV according to patient size and/or use of iterative reconstruction technique. CONTRAST:  OMNIPAQUE IOHEXOL 350 MG/ML SOLN COMPARISON:  CT chest dated 07/06/2022 FINDINGS: Cardiovascular: Satisfactory opacification of the bilateral pulmonary arteries to the lobar level. Mild respiratory motion in the bilateral lower lobes. No evidence of pulmonary embolism. Although not tailored for evaluation of the thoracic aorta, there is no evidence of thoracic aortic aneurysm or dissection. Moderate coronary atherosclerosis of the LAD and right coronary artery. Mediastinum/Nodes: No suspicious mediastinal lymphadenopathy. Visualized thyroid is unremarkable. Lungs/Pleura: Small bilateral pleural effusions. Associated mild patchy bilateral lower lobe opacities (for example, series 5/image 48), with a subpleural/peripheral distribution, favoring mild COVID pneumonia in this clinical setting. 8 mm subpleural nodule in the left lower lobe (series 5/image 59), unchanged/chronic, benign. No follow-up is recommended. No pneumothorax. Upper Abdomen: Visualized upper abdomen is grossly unremarkable. Musculoskeletal: Very mild degenerative changes of the lower thoracic spine. Review of the MIP images confirms the above findings. IMPRESSION: No evidence of pulmonary embolism. Mild patchy bilateral lower lobe opacities, favoring mild COVID pneumonia in this clinical setting. Small bilateral pleural effusions. Electronically Signed   By: Charline Bills M.D.   On: 02/22/2023 21:20   DG Chest 2  View  Result Date: 02/22/2023 CLINICAL DATA:  Short of breath EXAM: CHEST - 2 VIEW COMPARISON:  06/03/2022, CT 07/06/2022 FINDINGS: No acute airspace disease or pleural effusion. Stable cardiomediastinal silhouette. No pneumothorax. IMPRESSION: No active cardiopulmonary disease. Electronically Signed   By: Jasmine Pang M.D.   On: 02/22/2023 19:56    EKG: I independently viewed the EKG done and my findings are as followed: Sinus tachycardia rate of 101.  Nonspecific ST-T changes.  QTc 446.  Assessment/Plan Present on Admission:  COVID-19  Principal Problem:   COVID-19  COVID-19 viral infection with concern for mild COVID-pneumonia seen on CT scan Started IV Solu-Medrol and IV Remdesivir P.o. Paxlovid interacts with his  cardiac medications DuoNebs every 6 hours As needed antitussives, Tessalon Perles Incentive spirometer Mobilize as tolerated  Acute on chronic hypoxic respiratory failure secondary to the above Not on oxygen supplementation at baseline Currently on 3 L to maintain O2 saturation above 92% Wean off oxygen supplementation as tolerated Mobilize as tolerated  Coronary artery disease Currently denies any anginal symptoms Continue to monitor on telemetry Resume home aspirin and high intensity Lipitor 80 mg daily Mild peripheral edema on exam BNP 116 1 dose of IV Lasix 20 mg x 1 Monitor strict I's and O's and daily weight No echo seen on file/medical records Consider getting a 2D echo if dyspnea recurs.  Type 2 diabetes with hyperglycemia Last hemoglobin A1c 9.3 on 01/07/2021 Obtain hemoglobin A1c Heart healthy carb modified diet. Start insulin sliding scale.  Severe obesity BMI 39 Recommend weight loss outpatient with regular physical activity and healthy dieting.   Time: 75 minutes.   DVT prophylaxis: Subcu Lovenox daily  Code Status: Full code  Family Communication: None at bedside  Disposition Plan: Admitted to telemetry unit by Dr. Julian Reil, [facility],  hospitalist service.  Consults called: None.  Admission status: Observation status.   Status is: Observation    Darlin Drop MD [facility] Pager [phone]  If 7PM-7AM, please contact night-coverage [email or web address] Password [email or web address]  02/23/2023, 3:30 AM

## 2023-02-23 NOTE — Progress Notes (Signed)
Patient seen and examined personally, I reviewed the chart, history and physical and admission note, done by admitting physician this morning and agree with the same with following addendum.  Please refer to the morning admission note for more detailed plan of care.  Briefly,  62 y.o.m w/obesity, type 2 diabetes, coronary artery disease, hypertension, hyperlipidemia, who presented with complaints of shortness of breath x2 days and progressively worsening w/ associated nonproductive cough, pleuritic chest discomfort,nasal congestion.  Returned from trip to [place] on Sunday 02/19/23 Seen in the [facility] ED: Hypoxic 87 to 88% on room air 83 to nasal cannula, vital stable, labs fairly stable, COVID-19 screening positive CT angio chest>>negative for pulmonary embolism showed-mild patchy bilateral lower lobe opacities, favoring mild COVID,pneumonia in this clinical setting,small bilateral pleural effusions. Troponin negative x 2 BNP 116 . Patient started on antiviral remdesivir, Solu-Medrol and admitted for further management   This morning seen and examined, he feels about 61% better is still needing oxygen, wife at the bedside. No shortness of breath.no chest pain  A/P: Pneumonia due to COVID-19 infection  Acute hypoxic respiratory failure due to pneumonia : Continue antitussives, supplemental oxygen systemic steroid and remdesivir, pharmacy dosing.  Check inflammatory markers  CAD/HLD: Troponin negative x 2.  Received Lasix x 1.  Consider echocardiogram if dyspnea recurs.  Continue aspirin 81, statin.  T2DM with hyperglycemia: blood sugar up to 203, continue SSI  Obesity with BMI 38.4

## 2023-02-23 NOTE — Progress Notes (Signed)
Paged Kc, MD because pt's BS=370mg /dl and pt requesting for his long acting insulin. He takes 60 units of long acting insulin at home.

## 2023-02-23 NOTE — Hospital Course (Addendum)
61 y.o.m w/obesity, type 2 diabetes, coronary artery disease, hypertension, hyperlipidemia, who presented with complaints of shortness of breath x2 days and progressively worsening w/ associated nonproductive cough, pleuritic chest discomfort,nasal congestion.  Returned from trip to [place] on Sunday 02/19/23 Seen in the [facility] ED: Hypoxic 87 to 88% on room air 83 to nasal cannula, vital stable, labs fairly stable, COVID-19 screening positive CT angio chest>>negative for pulmonary embolism showed-mild patchy bilateral lower lobe opacities, favoring mild COVID,pneumonia in this clinical setting,small bilateral pleural effusions. Troponin negative x 2 BNP 116 . Patient started on antiviral remdesivir, Solu-Medrol and admitted for further management

## 2023-02-24 DIAGNOSIS — U071 COVID-19: Secondary | ICD-10-CM | POA: Diagnosis not present

## 2023-02-24 LAB — GLUCOSE, CAPILLARY
Glucose-Capillary: 259 mg/dL — ABNORMAL HIGH (ref 70–99)
Glucose-Capillary: 268 mg/dL — ABNORMAL HIGH (ref 70–99)
Glucose-Capillary: 317 mg/dL — ABNORMAL HIGH (ref 70–99)
Glucose-Capillary: 280 mg/dL — ABNORMAL HIGH (ref 70–99)

## 2023-02-24 LAB — COMPREHENSIVE METABOLIC PANEL
ALT: 21 U/L (ref 0–44)
AST: 23 U/L (ref 15–41)
Albumin: 2.7 g/dL — ABNORMAL LOW (ref 3.5–5.0)
Alkaline Phosphatase: 91 U/L (ref 38–126)
Anion gap: 9 (ref 5–15)
BUN: 25 mg/dL — ABNORMAL HIGH (ref 6–20)
CO2: 26 mmol/L (ref 22–32)
Calcium: 8.2 mg/dL — ABNORMAL LOW (ref 8.9–10.3)
Chloride: 98 mmol/L (ref 98–111)
Creatinine, Ser: 1.08 mg/dL (ref 0.61–1.24)
GFR, Estimated: >60 mL/min (ref >60)
Glucose, Bld: 299 mg/dL — ABNORMAL HIGH (ref 70–99)
Potassium: 5.1 mmol/L (ref 3.5–5.1)
Sodium: 133 mmol/L — ABNORMAL LOW (ref 135–145)
Total Bilirubin: 0.5 mg/dL (ref 0.3–1.2)
Total Protein: 6.9 g/dL (ref 6.5–8.1)

## 2023-02-24 LAB — CBC
HCT: 41 % (ref 39.0–52.0)
Hemoglobin: 13.2 g/dL (ref 13.0–17.0)
MCH: 27.7 pg (ref 26.0–34.0)
MCHC: 32.2 g/dL (ref 30.0–36.0)
MCV: 86.1 fL (ref 80.0–100.0)
Platelets: 221 K/uL (ref 150–400)
RBC: 4.76 MIL/uL (ref 4.22–5.81)
RDW: 13.8 % (ref 11.5–15.5)
WBC: 10.8 K/uL — ABNORMAL HIGH (ref 4.0–10.5)
nRBC: 0 % (ref 0.0–0.2)

## 2023-02-24 LAB — C-REACTIVE PROTEIN: CRP: 4.2 mg/dL — ABNORMAL HIGH (ref <1.0)

## 2023-02-24 LAB — PROCALCITONIN: Procalcitonin: 0.15 ng/mL

## 2023-02-24 MED ORDER — SODIUM CHLORIDE 0.9 % IV SOLN
2.0000 g | Freq: Every day | INTRAVENOUS | Status: DC
  Administered 2023-02-24 – 2023-02-26 (×3): 2 g via INTRAVENOUS
  Filled 2023-02-24 (×3): qty 20

## 2023-02-24 MED ORDER — INSULIN GLARGINE-YFGN 100 UNIT/ML ~~LOC~~ SOLN
30.0000 [IU] | Freq: Every day | SUBCUTANEOUS | Status: DC
  Administered 2023-02-24 – 2023-02-25 (×2): 30 [IU] via SUBCUTANEOUS
  Filled 2023-02-24 (×3): qty 0.3

## 2023-02-24 MED ORDER — SODIUM CHLORIDE 0.9 % IV SOLN
500.0000 mg | Freq: Every day | INTRAVENOUS | Status: DC
  Administered 2023-02-24 – 2023-02-26 (×3): 500 mg via INTRAVENOUS
  Filled 2023-02-24 (×3): qty 5

## 2023-02-24 MED ORDER — INSULIN ASPART 100 UNIT/ML IJ SOLN
8.0000 [IU] | Freq: Three times a day (TID) | INTRAMUSCULAR | Status: DC
  Administered 2023-02-25 – 2023-02-26 (×4): 8 [IU] via SUBCUTANEOUS

## 2023-02-24 NOTE — Inpatient Diabetes Management (Signed)
Inpatient Diabetes Program Recommendations  AACE/ADA: New Consensus Statement on Inpatient Glycemic Control (2015)  Target Ranges:  Prepandial:   less than 140 mg/dL      Peak postprandial:   less than 180 mg/dL (1-2 hours)      Critically ill patients:  140 - 180 mg/dL   Lab Results  Component Value Date   GLUCAP 268 (H) 02/24/2023   HGBA1C 9.9 (H) 02/23/2023    Review of Glycemic Control  Diabetes history: DM2 Outpatient Diabetes medications: Toujeo 60 every day, Trulicity 1.5 mg weekly, Humalog 20-25 units TID Current orders for Inpatient glycemic control: Semglee 10 at bedtime, Novolog 0-20 TID with meals and 0-5 HS  On Solumedrol 40 every day  HgbA1C - 9.9% CBGs 259, 268  Inpatient Diabetes Program Recommendations:    Increase Semglee to 30 units at bedtime, titrate as needed up to home dose of 60 units QHS  Consider adding Novolog 8 units TID with meals if eating > 50%  Continue to follow.  Thank you. Ailene Ards, RD, LDN, CDCES Inpatient Diabetes Coordinator [phone]

## 2023-02-24 NOTE — Progress Notes (Signed)
PROGRESS NOTE    Fernando Nelson  WUJ:[id number]  DOB: 17-Nov-1960  DOA: 02/22/2023 PCP: Noberto Retort, MD Outpatient Specialists:   Hospital course:  62 year old with obesity, DM2, CAD, HTN was admitted yesterday with acute hypoxic respiratory failure.  Workup is notable for positive COVID screen as well as procalcitonin at 0.37.  Patient was started on steroids, remdesivir as well as treatment for CAP with ceftriaxone and azithromycin.  Subjective:  Patient thinks he is feeling better.  Notes that he no longer has any chest discomfort.  He still does have his nonproductive cough although that may also be improving.  His main concern is his elevated blood sugars.  His wife gave me a list of his insulin dosing at home.  Objective: Vitals:   02/24/23 0436 02/24/23 0709 02/24/23 0758 02/24/23 1204  BP: (!) 147/58   135/63  Pulse: 93   94  Resp: 18   18  Temp: 98.1 F (36.7 C)   98.4 F (36.9 C)  TempSrc: Oral   Oral  SpO2: 90%  92% 91%  Weight:  113.4 kg    Height:        Intake/Output Summary (Last 24 hours) at 02/24/2023 1717 Last data filed at 02/24/2023 1111 Gross per 24 hour  Intake 240 ml  Output --  Net 240 ml   Filed Weights   02/22/23 1935 02/23/23 0500 02/24/23 0709  Weight: 115 kg 111.4 kg 113.4 kg     Exam:  General: Patient with centripetal obesity sitting in bed in good spirits talking and laughing with his wife Eyes: sclera anicteric, conjuctiva mild injection bilaterally CVS: S1-S2, regular  Respiratory:  decreased air entry bilaterally secondary to decreased inspiratory effort, rales at bases  GI: Obese, NABS, soft, NT  LE: Warm and well-perfused Neuro: A/O x 3,  grossly nonfocal.  Psych: patient is logical and coherent, judgement and insight appear normal, mood and affect appropriate to situation.  Data Reviewed:  Basic Metabolic Panel: Recent Labs  Lab 02/22/23 1952 02/23/23 0510 02/24/23 0538  NA 132* 139 133*  K 4.2 4.3 5.1  CL 98  100 98  CO2 26 29 26   GLUCOSE 154* 179* 299*  BUN 15 16 25*  CREATININE 0.99 1.02 1.08  CALCIUM 8.2* 8.6* 8.2*  MG  --  1.9  --   PHOS  --  3.7  --     CBC: Recent Labs  Lab 02/22/23 1952 02/23/23 0510 02/24/23 0538  WBC 8.3 7.1 10.8*  HGB 13.8 13.7 13.2  HCT 41.8 43.0 41.0  MCV 85.3 88.8 86.1  PLT 204 192 221     Scheduled Meds:  aspirin EC  81 mg Oral q morning   atorvastatin  80 mg Oral Daily   benzonatate  200 mg Oral TID   enoxaparin (LOVENOX) injection  55 mg Subcutaneous Q24H   folic acid  1 mg Oral Daily   insulin aspart  0-20 Units Subcutaneous TID WC   insulin aspart  0-5 Units Subcutaneous QHS   insulin aspart  8 Units Subcutaneous TID WC   insulin glargine-yfgn  30 Units Subcutaneous QHS   ipratropium-albuterol  3 mL Nebulization BID   methylPREDNISolone (SOLU-MEDROL) injection  40 mg Intravenous Daily   multivitamin with minerals  1 tablet Oral Daily   thiamine  100 mg Oral Daily   Continuous Infusions:  azithromycin 500 mg (02/24/23 1040)   cefTRIAXone (ROCEPHIN)  IV 2 g (02/24/23 1005)   remdesivir 100 mg in sodium  chloride 0.9 % 100 mL IVPB 100 mg (02/24/23 1222)     Assessment & Plan:   Acute hypoxic respiratory failure COVID-19 pneumonia CAP Continue treatment with remdesivir and Solu-Medrol as initiated Continue ceftriaxone and azithromycin Oxygen was tapered down to 1 L when I saw him today Would see if patient can tolerate ambulation off oxygen tomorrow and if so would discharge home on outpatient treatment.  DM2 Persistently elevated blood sugars Very much appreciate inpatient diabetes program recommendations Increase glargine to 30 units per their recommendations Add aspart 8 units 3 times daily AC Continue SSI coverage and uptitrate insulin as needed  CAD No acute ischemic issues Of note patient was treated with Lasix in ED Patient denies history of heart failure Patient can get echocardiogram either as an inpatient or  outpatient as warranted Continue aspirin and atorvastatin   DVT prophylaxis: Lovenox Code Status: Full Family Communication: Wife was at bedside throughout     Studies: CT Angio Chest PE W and/or Wo Contrast  Result Date: 02/22/2023 CLINICAL DATA:  Sore throat, cough, congestion, fever, COVID positive EXAM: CT ANGIOGRAPHY CHEST WITH CONTRAST TECHNIQUE: Multidetector CT imaging of the chest was performed using the standard protocol during bolus administration of intravenous contrast. Multiplanar CT image reconstructions and MIPs were obtained to evaluate the vascular anatomy. RADIATION DOSE REDUCTION: This exam was performed according to the departmental dose-optimization program which includes automated exposure control, adjustment of the mA and/or kV according to patient size and/or use of iterative reconstruction technique. CONTRAST:  OMNIPAQUE IOHEXOL 350 MG/ML SOLN COMPARISON:  CT chest dated 07/06/2022 FINDINGS: Cardiovascular: Satisfactory opacification of the bilateral pulmonary arteries to the lobar level. Mild respiratory motion in the bilateral lower lobes. No evidence of pulmonary embolism. Although not tailored for evaluation of the thoracic aorta, there is no evidence of thoracic aortic aneurysm or dissection. Moderate coronary atherosclerosis of the LAD and right coronary artery. Mediastinum/Nodes: No suspicious mediastinal lymphadenopathy. Visualized thyroid is unremarkable. Lungs/Pleura: Small bilateral pleural effusions. Associated mild patchy bilateral lower lobe opacities (for example, series 5/image 48), with a subpleural/peripheral distribution, favoring mild COVID pneumonia in this clinical setting. 8 mm subpleural nodule in the left lower lobe (series 5/image 59), unchanged/chronic, benign. No follow-up is recommended. No pneumothorax. Upper Abdomen: Visualized upper abdomen is grossly unremarkable. Musculoskeletal: Very mild degenerative changes of the lower thoracic spine.  Review of the MIP images confirms the above findings. IMPRESSION: No evidence of pulmonary embolism. Mild patchy bilateral lower lobe opacities, favoring mild COVID pneumonia in this clinical setting. Small bilateral pleural effusions. Electronically Signed   By: Charline Bills M.D.   On: 02/22/2023 21:20   DG Chest 2 View  Result Date: 02/22/2023 CLINICAL DATA:  Short of breath EXAM: CHEST - 2 VIEW COMPARISON:  06/03/2022, CT 07/06/2022 FINDINGS: No acute airspace disease or pleural effusion. Stable cardiomediastinal silhouette. No pneumothorax. IMPRESSION: No active cardiopulmonary disease. Electronically Signed   By: Jasmine Pang M.D.   On: 02/22/2023 19:56    Principal Problem:   COVID-19     Pieter Partridge, [facility]  If 7PM-7AM, please contact night-coverage [email or web address]   LOS: 1 day

## 2023-02-25 DIAGNOSIS — U071 COVID-19: Secondary | ICD-10-CM | POA: Diagnosis not present

## 2023-02-25 LAB — CBC
HCT: 40.5 % (ref 39.0–52.0)
Hemoglobin: 13 g/dL (ref 13.0–17.0)
MCH: 28 pg (ref 26.0–34.0)
MCHC: 32.1 g/dL (ref 30.0–36.0)
MCV: 87.1 fL (ref 80.0–100.0)
Platelets: 226 10*3/uL (ref 150–400)
RBC: 4.65 MIL/uL (ref 4.22–5.81)
RDW: 14.1 % (ref 11.5–15.5)
WBC: 11.7 10*3/uL — ABNORMAL HIGH (ref 4.0–10.5)
nRBC: 0 % (ref 0.0–0.2)

## 2023-02-25 LAB — BASIC METABOLIC PANEL
Anion gap: 8 (ref 5–15)
BUN: 27 mg/dL — ABNORMAL HIGH (ref 6–20)
CO2: 28 mmol/L (ref 22–32)
Calcium: 8.3 mg/dL — ABNORMAL LOW (ref 8.9–10.3)
Chloride: 101 mmol/L (ref 98–111)
Creatinine, Ser: 1 mg/dL (ref 0.61–1.24)
GFR, Estimated: >60 mL/min (ref >60)
Glucose, Bld: 211 mg/dL — ABNORMAL HIGH (ref 70–99)
Potassium: 4.5 mmol/L (ref 3.5–5.1)
Sodium: 137 mmol/L (ref 135–145)

## 2023-02-25 LAB — GLUCOSE, CAPILLARY
Glucose-Capillary: 187 mg/dL — ABNORMAL HIGH (ref 70–99)
Glucose-Capillary: 218 mg/dL — ABNORMAL HIGH (ref 70–99)
Glucose-Capillary: 253 mg/dL — ABNORMAL HIGH (ref 70–99)
Glucose-Capillary: 215 mg/dL — ABNORMAL HIGH (ref 70–99)

## 2023-02-25 MED ORDER — BENZONATATE 100 MG PO CAPS
100.0000 mg | ORAL_CAPSULE | Freq: Once | ORAL | Status: DC
  Filled 2023-02-25: qty 1

## 2023-02-25 MED ORDER — SODIUM CHLORIDE 0.9 % IV SOLN: 100.0000 mg | Freq: Every day | INTRAVENOUS | Status: DC

## 2023-02-25 NOTE — Progress Notes (Signed)
PROGRESS NOTE    KEYMANI BETTEN  JXB:[id number]  DOB: 01/25/1961  DOA: 02/22/2023 PCP: Noberto Retort, MD Outpatient Specialists:   Hospital course:  62 year old with obesity, DM2, CAD, HTN was admitted yesterday with acute hypoxic respiratory failure.  Workup is notable for positive COVID screen as well as procalcitonin at 0.37.  Patient was started on steroids, remdesivir as well as treatment for CAP with ceftriaxone and azithromycin.  Subjective:  Feels better, wants to go home.   Objective: Vitals:   02/25/23 0512 02/25/23 0924 02/25/23 1206 02/25/23 1406  BP: (!) 174/73   (!) 160/84  Pulse: 84   88  Resp: 20   17  Temp: 98 F (36.7 C)   (!) 97.5 F (36.4 C)  TempSrc: Oral   Oral  SpO2: 99% 96% 92% 92%  Weight:      Height:        Intake/Output Summary (Last 24 hours) at 02/25/2023 1622 Last data filed at 02/25/2023 1308 Gross per 24 hour  Intake 1290.11 ml  Output --  Net 1290.11 ml   Filed Weights   02/22/23 1935 02/23/23 0500 02/24/23 0709  Weight: 115 kg 111.4 kg 113.4 kg     Exam:  General: Patient with centripetal obesity sitting in bed in good spirits talking and laughing with his wife Eyes: sclera anicteric, conjuctiva mild injection bilaterally CVS: S1-S2, regular  Respiratory:  decreased air entry bilaterally secondary to decreased inspiratory effort, rales at bases  GI: Obese, NABS, soft, NT  LE: Warm and well-perfused Neuro: A/O x 3,  grossly nonfocal.  Psych: patient is logical and coherent, judgement and insight appear normal, mood and affect appropriate to situation.  Data Reviewed:  Basic Metabolic Panel: Recent Labs  Lab 02/22/23 1952 02/23/23 0510 02/24/23 0538 02/25/23 0523  NA 132* 139 133* 137  K 4.2 4.3 5.1 4.5  CL 98 100 98 101  CO2 26 29 26 28   GLUCOSE 154* 179* 299* 211*  BUN 15 16 25* 27*  CREATININE 0.99 1.02 1.08 1.00  CALCIUM 8.2* 8.6* 8.2* 8.3*  MG  --  1.9  --   --   PHOS  --  3.7  --   --      CBC: Recent Labs  Lab 02/22/23 1952 02/23/23 0510 02/24/23 0538 02/25/23 0523  WBC 8.3 7.1 10.8* 11.7*  HGB 13.8 13.7 13.2 13.0  HCT 41.8 43.0 41.0 40.5  MCV 85.3 88.8 86.1 87.1  PLT 204 192 221 226     Scheduled Meds:  aspirin EC  81 mg Oral q morning   atorvastatin  80 mg Oral Daily   benzonatate  200 mg Oral TID   enoxaparin (LOVENOX) injection  55 mg Subcutaneous Q24H   folic acid  1 mg Oral Daily   insulin aspart  0-20 Units Subcutaneous TID WC   insulin aspart  0-5 Units Subcutaneous QHS   insulin aspart  8 Units Subcutaneous TID WC   insulin glargine-yfgn  30 Units Subcutaneous QHS   ipratropium-albuterol  3 mL Nebulization BID   methylPREDNISolone (SOLU-MEDROL) injection  40 mg Intravenous Daily   multivitamin with minerals  1 tablet Oral Daily   thiamine  100 mg Oral Daily   Continuous Infusions:  azithromycin 500 mg (02/25/23 1044)   cefTRIAXone (ROCEPHIN)  IV 2 g (02/25/23 1226)     Assessment & Plan:   Acute hypoxic respiratory failure COVID-19 pneumonia CAP Continue treatment with remdesivir and Solu-Medrol as initiated Continue ceftriaxone and azithromycin  Oxygen was tapered down to room air, but unfortunately dropped O2 satuations with ambulation; will try to see if he will be able to come off o2 completely tomorrow otherwise can try to send home with home o2  DM2 Persistently elevated blood sugars Very much appreciate inpatient diabetes program recommendations Increase glargine to 30 units per their recommendations Add aspart 8 units 3 times daily AC Continue SSI coverage and uptitrate insulin as needed  CAD No acute ischemic issues Patient denies history of heart failure Continue aspirin and atorvastatin   DVT prophylaxis: Lovenox Code Status: Full Family Communication: Wife was at bedside throughout   Studies: No results found.  Principal Problem:   COVID-19     Charolotte Eke, [facility]  If 7PM-7AM, please  contact night-coverage [email or web address]   LOS: 2 days

## 2023-02-25 NOTE — Progress Notes (Signed)
Mobility Specialist - Progress Note   02/25/23 1206  Oxygen Therapy  SpO2 92 %  O2 Device Nasal Cannula  O2 Flow Rate (L/min) 1 L/min  Mobility  Activity Ambulated with assistance in hallway  Level of Assistance Modified independent, requires aide device or extra time  Assistive Device None  Distance Ambulated (ft) 500 ft  Range of Motion/Exercises Active  Activity Response Tolerated well  Mobility Referral Yes  $Mobility charge 1 Mobility  Mobility Specialist Start Time (ACUTE ONLY) 1155  Mobility Specialist Stop Time (ACUTE ONLY) 1205  Mobility Specialist Time Calculation (min) (ACUTE ONLY) 10 min   Pt received in bed and agreed to mobility.   Nurse requested Mobility Specialist to perform oxygen saturation test with pt which includes removing pt from oxygen both at rest and while ambulating.  Below are the results from that testing.     Patient Saturations on Room Air at Rest = spO2 92%  Patient Saturations on Room Air while Ambulating = sp02 85% .  Rested and performed pursed lip breathing for 1 minute with sp02 at 87%.  Patient Saturations on 1 Liters of oxygen while Ambulating = sp02 92%  At end of testing pt left in room on 2 Liters of oxygen.  Reported results to nurse.    Pt returned to bed with all needs met.  Marilynne Halsted Mobility Specialist

## 2023-02-26 DIAGNOSIS — U071 COVID-19: Secondary | ICD-10-CM | POA: Diagnosis not present

## 2023-02-26 LAB — GLUCOSE, CAPILLARY
Glucose-Capillary: 165 mg/dL — ABNORMAL HIGH (ref 70–99)
Glucose-Capillary: 209 mg/dL — ABNORMAL HIGH (ref 70–99)

## 2023-02-26 MED ORDER — ENOXAPARIN SODIUM 60 MG/0.6ML IJ SOSY: 60.0000 mg | PREFILLED_SYRINGE | INTRAMUSCULAR | Status: DC

## 2023-02-26 MED ORDER — ALBUTEROL SULFATE HFA 108 (90 BASE) MCG/ACT IN AERS: 2.0000 | INHALATION_SPRAY | Freq: Four times a day (QID) | RESPIRATORY_TRACT | 2 refills | Status: DC | PRN

## 2023-02-26 MED ORDER — GUAIFENESIN 200 MG PO TABS: 200.0000 mg | ORAL_TABLET | ORAL | 0 refills | Status: DC | PRN

## 2023-02-26 NOTE — Progress Notes (Signed)
SATURATION QUALIFICATIONS:   Patient Saturations on Room Air at Rest = 96%  Patient Saturations on Room Air while Ambulating = 90-93%  The whole night patient was on RA without O2.

## 2023-02-26 NOTE — Progress Notes (Signed)
Patient discharged via wheelchair accompanied by wife. Pt. Alert and oriented, not in distress. Discharge instruction and education given to wife and patient. All belongings are with the family.

## 2023-02-26 NOTE — TOC Transition Note (Signed)
Transition of Care [facility]) - CM/SW Discharge Note   Patient Details  Name: Fernando Nelson MRN: [id number] Date of Birth: May 30, 1961  [facility] CM/SW Contact:  Adrian Prows, RN Phone Number: 02/26/2023, 11:58 AM   Clinical Narrative:    No TOC needs identified.   Final next level of care: Home/Self Care Barriers to Discharge: No Barriers Identified   Patient Goals and CMS Choice      Discharge Placement                         Discharge Plan and Services Additional resources added to the After Visit Summary for                                       Social Determinants of Health (SDOH) Interventions SDOH Screenings   Depression (PHQ2-9): Medium Risk (10/07/2020)  Tobacco Use: Medium Risk (02/22/2023)     Readmission Risk Interventions     No data to display

## 2023-02-28 NOTE — Discharge Summary (Signed)
Physician Discharge Summary   Patient: Fernando Nelson MRN: [id number] DOB: Dec 29, 1960  Admit date:     02/22/2023  Discharge date: 02/26/2023  Discharge Physician: Charolotte Eke   PCP: Noberto Retort, MD   Recommendations at discharge:    Follow up with PCP  Discharge Diagnoses: Principal Problem:   COVID-19  Resolved Problems:   * No resolved hospital problems. [facility] Course: 62 y.o.m w/obesity, type 2 diabetes, coronary artery disease, hypertension, hyperlipidemia, who presented with complaints of shortness of breath x2 days and progressively worsening w/ associated nonproductive cough, pleuritic chest discomfort,nasal congestion.  Returned from trip to [place] on Sunday 02/19/23 Seen in the [facility] ED: Hypoxic 87 to 88% on room air 83 to nasal cannula, vital stable, labs fairly stable, COVID-19 screening positive CT angio chest>>negative for pulmonary embolism showed-mild patchy bilateral lower lobe opacities, favoring mild COVID,pneumonia in this clinical setting,small bilateral pleural effusions. Troponin negative x 2 BNP 116 . Patient started on antiviral remdesivir, Solu-Medrol and admitted for further management  Assessment and Plan:  Acute hypoxic respiratory failure COVID-19 pneumonia CAP Treatment with remdesivir and Solu-Medrol as initiated, as well as CRO and azithromycin started on admission. He received 4 days of remdesivir and 3 days of CAP coverage. He was initally requiring 3 L of O2, weaned to room air and tolerating ambulation without oxygen.   DM2 Persistently elevated blood sugars in setting of steroid burst. Steroids discontinued on DC, recommend resume home regimen. Recommend statin.    CAD Continue aspirin and atorvastatin       Consultants: None Procedures performed: None Disposition: Home Diet recommendation:  Discharge Diet Orders (From admission, onward)     Start     Ordered   02/26/23 0000  Diet - low sodium heart healthy         07 /07/24 1156           Regular diet DISCHARGE MEDICATION: Allergies as of 02/26/2023   No Known Allergies      Medication List     STOP taking these medications    gabapentin 100 MG capsule Commonly known as: NEURONTIN       TAKE these medications    acetaminophen 325 MG tablet Commonly known as: TYLENOL Take 650 mg by mouth as needed for moderate pain.   albuterol 108 (90 Base) MCG/ACT inhaler Commonly known as: VENTOLIN HFA Inhale 2 puffs into the lungs every 6 (six) hours as needed for wheezing or shortness of breath.   aspirin EC 81 MG tablet Take 81 mg by mouth daily.   atorvastatin 80 MG tablet Commonly known as: LIPITOR Take 1 tablet (80 mg total) by mouth daily.   clobetasol 0.05 % external solution Commonly known as: TEMOVATE Apply topically 2 (two) times daily.   clobetasol cream 0.05 % Commonly known as: TEMOVATE Apply 1 Application topically in the morning and at bedtime.   guaiFENesin 200 MG tablet Take 1 tablet (200 mg total) by mouth every 4 (four) hours as needed for cough or to loosen phlegm.   HumaLOG KwikPen 200 UNIT/ML KwikPen Generic drug: insulin lispro Inject 20-25 Units into the skin in the morning, at noon, and at bedtime. Depending on BS reading   icosapent Ethyl 1 g capsule Commonly known as: VASCEPA Take 2 capsules (2 g total) by mouth 2 (two) times daily.   losartan 50 MG tablet Commonly known as: COZAAR Take 50 mg by mouth daily.   metoprolol tartrate 25 MG tablet Commonly known as:  LOPRESSOR TAKE 1 TABLET BY MOUTH TWICE DAILY. What changed:  how much to take how to take this when to take this additional instructions   multivitamin with minerals Tabs tablet Take 1 tablet by mouth daily.   nitroGLYCERIN 0.4 MG SL tablet Commonly known as: NITROSTAT Place 1 tablet (0.4 mg total) under the tongue every 5 (five) minutes x 3 doses as needed for chest pain.   ondansetron 8 MG disintegrating tablet Commonly known  as: ZOFRAN-ODT Take 1 tablet (8 mg total) by mouth every 8 (eight) hours as needed for nausea or vomiting. What changed: when to take this   pantoprazole 40 MG tablet Commonly known as: PROTONIX Take 40 mg by mouth daily.   Toujeo SoloStar 300 UNIT/ML Solostar Pen Generic drug: insulin glargine (1 Unit Dial) Inject 60 Units into the skin daily.   Trulicity 1.5 MG/0.5ML Sopn Generic drug: Dulaglutide Inject 1.5 mg into the skin once a week.        Discharge Exam: Filed Weights   02/24/23 0709 02/26/23 0500 02/26/23 0900  Weight: 113.4 kg 113.3 kg 114.6 kg   Physical Exam Vitals and nursing note reviewed.  Constitutional:      General: He is not in acute distress.    Appearance: He is not diaphoretic.  HENT:     Head: Atraumatic.  Eyes:     Pupils: Pupils are equal, round, and reactive to light.  Cardiovascular:     Rate and Rhythm: Normal rate and regular rhythm.     Pulses: Normal pulses.     Heart sounds: No murmur heard. Pulmonary:     Effort: Pulmonary effort is normal. No respiratory distress.     Breath sounds: Normal breath sounds. No rales.  Abdominal:     General: Abdomen is flat. There is no distension.     Palpations: Abdomen is soft.     Tenderness: There is no abdominal tenderness. There is no rebound.  Musculoskeletal:     Right lower leg: No edema.     Left lower leg: No edema.  Skin:    General: Skin is warm and dry.     Capillary Refill: Capillary refill takes less than 2 seconds.  Neurological:     Mental Status: He is alert and oriented to person, place, and time. Mental status is at baseline.  Psychiatric:        Mood and Affect: Mood normal.      Condition at discharge: good  The results of significant diagnostics from this hospitalization (including imaging, microbiology, ancillary and laboratory) are listed below for reference.   Imaging Studies: CT Angio Chest PE W and/or Wo Contrast  Result Date: 02/22/2023 CLINICAL DATA:  Sore  throat, cough, congestion, fever, COVID positive EXAM: CT ANGIOGRAPHY CHEST WITH CONTRAST TECHNIQUE: Multidetector CT imaging of the chest was performed using the standard protocol during bolus administration of intravenous contrast. Multiplanar CT image reconstructions and MIPs were obtained to evaluate the vascular anatomy. RADIATION DOSE REDUCTION: This exam was performed according to the departmental dose-optimization program which includes automated exposure control, adjustment of the mA and/or kV according to patient size and/or use of iterative reconstruction technique. CONTRAST:  OMNIPAQUE IOHEXOL 350 MG/ML SOLN COMPARISON:  CT chest dated 07/06/2022 FINDINGS: Cardiovascular: Satisfactory opacification of the bilateral pulmonary arteries to the lobar level. Mild respiratory motion in the bilateral lower lobes. No evidence of pulmonary embolism. Although not tailored for evaluation of the thoracic aorta, there is no evidence of thoracic aortic aneurysm or  dissection. Moderate coronary atherosclerosis of the LAD and right coronary artery. Mediastinum/Nodes: No suspicious mediastinal lymphadenopathy. Visualized thyroid is unremarkable. Lungs/Pleura: Small bilateral pleural effusions. Associated mild patchy bilateral lower lobe opacities (for example, series 5/image 48), with a subpleural/peripheral distribution, favoring mild COVID pneumonia in this clinical setting. 8 mm subpleural nodule in the left lower lobe (series 5/image 59), unchanged/chronic, benign. No follow-up is recommended. No pneumothorax. Upper Abdomen: Visualized upper abdomen is grossly unremarkable. Musculoskeletal: Very mild degenerative changes of the lower thoracic spine. Review of the MIP images confirms the above findings. IMPRESSION: No evidence of pulmonary embolism. Mild patchy bilateral lower lobe opacities, favoring mild COVID pneumonia in this clinical setting. Small bilateral pleural effusions. Electronically Signed   By:  Charline Bills M.D.   On: 02/22/2023 21:20   DG Chest 2 View  Result Date: 02/22/2023 CLINICAL DATA:  Short of breath EXAM: CHEST - 2 VIEW COMPARISON:  06/03/2022, CT 07/06/2022 FINDINGS: No acute airspace disease or pleural effusion. Stable cardiomediastinal silhouette. No pneumothorax. IMPRESSION: No active cardiopulmonary disease. Electronically Signed   By: Jasmine Pang M.D.   On: 02/22/2023 19:56    Microbiology: Results for orders placed or performed during the hospital encounter of 02/22/23  Resp panel by RT-PCR (RSV, Flu A&B, Covid) Anterior Nasal Swab     Status: Abnormal   Collection Time: 02/22/23  7:38 PM   Specimen: Anterior Nasal Swab  Result Value Ref Range Status   SARS Coronavirus 2 by RT PCR POSITIVE (A) NEGATIVE Final    Comment: (NOTE) SARS-CoV-2 target nucleic acids are DETECTED.  The SARS-CoV-2 RNA is generally detectable in upper respiratory specimens during the acute phase of infection. Positive results are indicative of the presence of the identified virus, but do not rule out bacterial infection or co-infection with other pathogens not detected by the test. Clinical correlation with patient history and other diagnostic information is necessary to determine patient infection status. The expected result is Negative.  Fact Sheet for Patients: BloggerCourse.com  Fact Sheet for Healthcare Providers: SeriousBroker.it  This test is not yet approved or cleared by the [place] FDA and  has been authorized for detection and/or diagnosis of SARS-CoV-2 by FDA under an Emergency Use Authorization (EUA).  This EUA will remain in effect (meaning this test can be used) for the duration of  the COVID-19 declaration under Section 564(b)(1) of the A ct, 21 U.S.C. section 360bbb-3(b)(1), unless the authorization is terminated or revoked sooner.     Influenza A by PCR NEGATIVE NEGATIVE Final   Influenza B by PCR  NEGATIVE NEGATIVE Final    Comment: (NOTE) The Xpert Xpress SARS-CoV-2/FLU/RSV plus assay is intended as an aid in the diagnosis of influenza from Nasopharyngeal swab specimens and should not be used as a sole basis for treatment. Nasal washings and aspirates are unacceptable for Xpert Xpress SARS-CoV-2/FLU/RSV testing.  Fact Sheet for Patients: BloggerCourse.com  Fact Sheet for Healthcare Providers: SeriousBroker.it  This test is not yet approved or cleared by the [place] FDA and has been authorized for detection and/or diagnosis of SARS-CoV-2 by FDA under an Emergency Use Authorization (EUA). This EUA will remain in effect (meaning this test can be used) for the duration of the COVID-19 declaration under Section 564(b)(1) of the Act, 21 U.S.C. section 360bbb-3(b)(1), unless the authorization is terminated or revoked.     Resp Syncytial Virus by PCR NEGATIVE NEGATIVE Final    Comment: (NOTE) Fact Sheet for Patients: BloggerCourse.com  Fact Sheet for Healthcare Providers: SeriousBroker.it  This test is not yet approved or cleared by the [place] and has been authorized for detection and/or diagnosis of SARS-CoV-2 by FDA under an Emergency Use Authorization (EUA). This EUA will remain in effect (meaning this test can be used) for the duration of the COVID-19 declaration under Section 564(b)(1) of the Act, 21 U.S.C. section 360bbb-3(b)(1), unless the authorization is terminated or revoked.  Performed at [facility], [address] [place], [place] [address]     Labs: CBC: Recent Labs  Lab 02/22/23 1952 02/23/23 0510 02/24/23 0538 02/25/23 0523  WBC 8.3 7.1 10.8* 11.7*  HGB 13.8 13.7 13.2 13.0  HCT 41.8 43.0 41.0 40.5  MCV 85.3 88.8 86.1 87.1  PLT 204 192 221 226   Basic Metabolic Panel: Recent Labs  Lab 02/22/23 1952 02/23/23 0510  02/24/23 0538 02/25/23 0523  NA 132* 139 133* 137  K 4.2 4.3 5.1 4.5  CL 98 100 98 101  CO2 26 29 26 28   GLUCOSE 154* 179* 299* 211*  BUN 15 16 25* 27*  CREATININE 0.99 1.02 1.08 1.00  CALCIUM 8.2* 8.6* 8.2* 8.3*  MG  --  1.9  --   --   PHOS  --  3.7  --   --    Liver Function Tests: Recent Labs  Lab 02/23/23 0510 02/24/23 0538  AST 20 23  ALT 23 21  ALKPHOS 92 91  BILITOT 0.5 0.5  PROT 6.9 6.9  ALBUMIN 2.9* 2.7*   CBG: Recent Labs  Lab 02/25/23 1155 02/25/23 1654 02/25/23 1957 02/26/23 0803 02/26/23 1214  GLUCAP 218* 253* 215* 165* 209*    Discharge time spent: less than 30 minutes.  Signed: Charolotte Eke, MD [facility] 02/28/2023

## 2023-03-03 DIAGNOSIS — R0602 Shortness of breath: Secondary | ICD-10-CM | POA: Diagnosis not present

## 2023-03-03 DIAGNOSIS — U071 COVID-19: Secondary | ICD-10-CM | POA: Diagnosis not present

## 2023-03-03 DIAGNOSIS — J189 Pneumonia, unspecified organism: Secondary | ICD-10-CM | POA: Diagnosis not present

## 2023-03-10 DIAGNOSIS — K760 Fatty (change of) liver, not elsewhere classified: Secondary | ICD-10-CM | POA: Diagnosis not present

## 2023-03-10 DIAGNOSIS — I2510 Atherosclerotic heart disease of native coronary artery without angina pectoris: Secondary | ICD-10-CM | POA: Diagnosis not present

## 2023-03-10 DIAGNOSIS — E782 Mixed hyperlipidemia: Secondary | ICD-10-CM | POA: Diagnosis not present

## 2023-03-10 DIAGNOSIS — E1165 Type 2 diabetes mellitus with hyperglycemia: Secondary | ICD-10-CM | POA: Diagnosis not present

## 2023-03-16 ENCOUNTER — Other Ambulatory Visit: Payer: Self-pay | Admitting: Gastroenterology

## 2023-03-16 DIAGNOSIS — K769 Liver disease, unspecified: Secondary | ICD-10-CM

## 2023-03-17 DIAGNOSIS — E113313 Type 2 diabetes mellitus with moderate nonproliferative diabetic retinopathy with macular edema, bilateral: Secondary | ICD-10-CM | POA: Diagnosis not present

## 2023-03-28 ENCOUNTER — Encounter: Payer: Self-pay | Admitting: Gastroenterology

## 2023-04-02 ENCOUNTER — Ambulatory Visit
Admission: RE | Admit: 2023-04-02 | Discharge: 2023-04-02 | Disposition: A | Discharge status: Home / Self Care | Payer: BC Managed Care – PPO | Source: Ambulatory Visit | Attending: Gastroenterology | Admitting: Gastroenterology

## 2023-04-02 DIAGNOSIS — K769 Liver disease, unspecified: Secondary | ICD-10-CM

## 2023-04-02 DIAGNOSIS — K7689 Other specified diseases of liver: Secondary | ICD-10-CM | POA: Diagnosis not present

## 2023-04-02 DIAGNOSIS — K862 Cyst of pancreas: Secondary | ICD-10-CM | POA: Diagnosis not present

## 2023-04-02 MED ORDER — GADOPICLENOL 0.5 MMOL/ML IV SOLN
10.0000 mL | Freq: Once | INTRAVENOUS | Status: AC | PRN
  Administered 2023-04-02: 10 mL via INTRAVENOUS

## 2023-04-21 DIAGNOSIS — E113313 Type 2 diabetes mellitus with moderate nonproliferative diabetic retinopathy with macular edema, bilateral: Secondary | ICD-10-CM | POA: Diagnosis not present

## 2023-06-08 ENCOUNTER — Other Ambulatory Visit: Payer: Self-pay | Admitting: Interventional Cardiology

## 2023-06-13 DIAGNOSIS — E559 Vitamin D deficiency, unspecified: Secondary | ICD-10-CM | POA: Diagnosis not present

## 2023-06-13 DIAGNOSIS — R809 Proteinuria, unspecified: Secondary | ICD-10-CM | POA: Diagnosis not present

## 2023-06-13 DIAGNOSIS — K760 Fatty (change of) liver, not elsewhere classified: Secondary | ICD-10-CM | POA: Diagnosis not present

## 2023-06-13 DIAGNOSIS — E1142 Type 2 diabetes mellitus with diabetic polyneuropathy: Secondary | ICD-10-CM | POA: Diagnosis not present

## 2023-06-13 DIAGNOSIS — E782 Mixed hyperlipidemia: Secondary | ICD-10-CM | POA: Diagnosis not present

## 2023-06-13 DIAGNOSIS — R946 Abnormal results of thyroid function studies: Secondary | ICD-10-CM | POA: Diagnosis not present

## 2023-06-13 DIAGNOSIS — E1165 Type 2 diabetes mellitus with hyperglycemia: Secondary | ICD-10-CM | POA: Diagnosis not present

## 2023-06-15 ENCOUNTER — Telehealth: Payer: Self-pay | Admitting: Pharmacy Technician

## 2023-06-15 NOTE — Telephone Encounter (Signed)
Pharmacy Patient Advocate Encounter   Received notification from Fax that prior authorization for vascepa is required/requested.   Insurance verification completed.   The patient is insured through [employer] .   Per test claim: PA required; PA submitted to [facility] via Fax Key/confirmation #/EOC faxed Status is pending

## 2023-06-30 ENCOUNTER — Other Ambulatory Visit (HOSPITAL_COMMUNITY): Payer: Self-pay

## 2023-06-30 ENCOUNTER — Other Ambulatory Visit: Payer: Self-pay | Admitting: Pharmacist

## 2023-06-30 DIAGNOSIS — E782 Mixed hyperlipidemia: Secondary | ICD-10-CM

## 2023-06-30 MED ORDER — ICOSAPENT ETHYL 1 G PO CAPS
2.0000 g | ORAL_CAPSULE | Freq: Two times a day (BID) | ORAL | 2 refills | Status: AC
Start: 2023-06-30 — End: ?

## 2023-06-30 NOTE — Telephone Encounter (Signed)
Pharmacy Patient Advocate Encounter  Received notification from [facility] that Prior Authorization for vascepa has been APPROVED from 05/31/23 to 06/29/24. Ran test claim, Copay is $0.00- one month. This test claim was processed through [facility]- copay amounts may vary at other pharmacies due to pharmacy/plan contracts, or as the patient moves through the different stages of their insurance plan.   PA #/Case ID/Reference #: [id number]

## 2023-08-25 DIAGNOSIS — E113312 Type 2 diabetes mellitus with moderate nonproliferative diabetic retinopathy with macular edema, left eye: Secondary | ICD-10-CM | POA: Diagnosis not present

## 2023-09-07 ENCOUNTER — Encounter: Payer: Self-pay | Admitting: Dermatology

## 2023-09-07 ENCOUNTER — Ambulatory Visit (INDEPENDENT_AMBULATORY_CARE_PROVIDER_SITE_OTHER): Payer: BC Managed Care – PPO | Admitting: Dermatology

## 2023-09-07 VITALS — BP 140/86

## 2023-09-07 DIAGNOSIS — D492 Neoplasm of unspecified behavior of bone, soft tissue, and skin: Secondary | ICD-10-CM

## 2023-09-07 DIAGNOSIS — L409 Psoriasis, unspecified: Secondary | ICD-10-CM | POA: Diagnosis not present

## 2023-09-07 DIAGNOSIS — Z139 Encounter for screening, unspecified: Secondary | ICD-10-CM

## 2023-09-07 DIAGNOSIS — D485 Neoplasm of uncertain behavior of skin: Secondary | ICD-10-CM

## 2023-09-07 DIAGNOSIS — D225 Melanocytic nevi of trunk: Secondary | ICD-10-CM | POA: Diagnosis not present

## 2023-09-07 MED ORDER — OTEZLA 30 MG PO TABS: 30.0000 mg | ORAL_TABLET | Freq: Two times a day (BID) | ORAL | 11 refills | Status: AC

## 2023-09-07 MED ORDER — TACROLIMUS 0.1 % EX OINT: TOPICAL_OINTMENT | Freq: Two times a day (BID) | CUTANEOUS | 0 refills | Status: DC

## 2023-09-07 NOTE — Progress Notes (Signed)
New Patient Visit   Subjective  Fernando Nelson is a 63 y.o. male who presents for the following: New Pt - Psoriasis  Patient states he  has dermatitis located at the scattered that he  would like to have examined. Patient reports the areas have been there for 6 months. He reports the areas are bothersome.Patient rates irritation 4 out of 10. He states that the areas have spread increasing in size. Patient reports he  has previously been treated for these areas by PCP. He was Rx Clobetasol solution for scalp and clobetasol propionate for areas on the body. He stated that is helps clear the flares but it recurs within 2-3 days after stopping use. Patient denies Hx of bx. Patient denies family history of skin cancer(s).  The patient has spots, moles and lesions to be evaluated, some may be new or changing and the patient may have concern these could be cancer.   The following portions of the chart were reviewed this encounter and updated as appropriate: medications, allergies, medical history  Review of Systems:  No other skin or systemic complaints except as noted in HPI or Assessment and Plan.  Objective  Well appearing patient in no apparent distress; mood and affect are within normal limits.   A focused examination was performed of the following areas: upper body & left thigh   Relevant exam findings are noted in the Assessment and Plan.                              Left Lower Back 1cm x 5mm irregular brown macule  Assessment & Plan   PSORIASIS Exam: Well-demarcated erythematous papules/plaques with silvery scale, guttate pink scaly papules involving arms, elbows, thighs and scalp. 15% BSA.  Flared  patient reports joint pain  Patient Education Provided: Psoriasis is a chronic non-curable, but treatable genetic/hereditary disease that may have other systemic features affecting other organ systems such as joints (Psoriatic Arthritis). It is associated with an  increased risk of inflammatory bowel disease, heart disease, non-alcoholic fatty liver disease, and depression.  Treatments include light and laser treatments; topical medications; and systemic medications including oral and injectables.   Assessment: Patient presents with recurrent rash responsive to steroid cream but relapses within 2-3 days of discontinuation. Examination reveals pink plaques with scale on arms, elbows, thighs, knees, scalp, chest, shoulder, and back. No nail involvement but does complain of joint pain. Body surface area (BSA) affected is approximately 15%. Diagnosis of psoriasis confirmed based on clinical presentation.   Plan:  Initiate Otezla (apremilast) with starter pack. Alternate topical clobetasol and tacrolimus every 2 weeks for 1-2 months.   Refill clobetasol cream and solution. Follow up in 4 months to assess Otezla efficacy. Obtain Therapist, occupational for [employer].   Consider Taltz (ixekizumab) as next step if [place] is ineffective.  Suspicious Nevus Assessment: Large dark mole on left mid-back, measuring 1 cm x 5 mm, described as an irregular brown macule. Clinically suspicious for potential melanoma or dysplastic nevus.  Plan: Perform shave biopsy of suspicious lesion. See procedure note below  Provide wound care instructions: remove Band-Aid during next shower, apply Aquaphor ointment and new Band-Aid. Inform patient of biopsy results within one week.   NEOPLASM OF UNCERTAIN BEHAVIOR OF SKIN Left Lower Back Skin / nail biopsy Type of biopsy: tangential   Informed consent: discussed and consent obtained   Timeout: patient name, date of birth, surgical site, and procedure verified  Procedure prep:  Patient was prepped and draped in usual sterile fashion Prep type:  Isopropyl alcohol Anesthesia: the lesion was anesthetized in a standard fashion   Anesthetic:  1% lidocaine w/ epinephrine 1-100,000 buffered w/ 8.4% NaHCO3 Instrument used: DermaBlade    Hemostasis achieved with: aluminum chloride   Outcome: patient tolerated procedure well   Post-procedure details: sterile dressing applied and wound care instructions given   Dressing type: petrolatum gauze and bandage   Specimen 1 - Surgical pathology Differential Diagnosis: r/o DN vs MM  Check Margins: Yes PSORIASIS   Related Medications tacrolimus (PROTOPIC) 0.1 % ointment Apply topically 2 (two) times daily. Use for 2 weeks then alternate use with clobetasol. Apremilast (OTEZLA) 30 MG TABS Take 1 tablet (30 mg total) by mouth 2 (two) times daily. SCREENING DUE   Related Procedures QuantiFERON-TB Gold Plus Acute Hep Panel & Hep B Surface Ab  No follow-ups on file.    Documentation: I have reviewed the above documentation for accuracy and completeness, and I agree with the above.   I, Shirron Marcha Solders, CMA, am acting as scribe for [employer], DO.   Langston Reusing, DO

## 2023-09-07 NOTE — Patient Instructions (Addendum)
Hello Fernando Nelson,  Thank you for visiting my office today. Your dedication to managing your psoriasis and improving your overall health is greatly appreciated. Below is a summary of the key instructions and next steps from our discussion:  Medications for Psoriasis:   Otezla Starter Pack: Begin using immediately. We are in the process of obtaining insurance approval, and you should receive your medication shortly.   Clobetasol Cream and Solution: Continue use for two weeks, followed by a two-week break.   Tacrolimus: Alternate with clobetasol every two weeks for the initial 1-2 months, until the [place] takes effect.  Biopsy Procedure for Suspicious Mole:   Procedure Details: The mole on your left mid-back will be removed to assess for melanoma or a dysplastic mole. This involves cleaning, numbing, and shaving off the mole.   Wound Care: After the procedure, remove the Band-Aid during your next shower, allow water to run over it, and then apply Aquaphor ointment and a new Band-Aid to aid in healing.  Follow-Up and Monitoring:   Hend[place] Effectiveness: We will assess the effectiveness of Otezla after four months.   Biopsy Results: Expect to receive the results in about a week.  Additional Information:   Alternative Treatments: If [place] proves ineffective, we may consider a biologic treatment like Taltz.   Pre-Treatment Screening: Screening for hepatitis and TB is necessary before starting a biologic, due to your medical history.  Please ensure to follow these instructions carefully. Should you have any questions or require further assistance, do not hesitate to contact our office. We are looking forward to your next visit in four months to evaluate your progress.  Best regards,  Dr. Langston Reusing Dermat[facility]      Patient Handout: Wound Care for Skin Biopsy Site  Taking Care of Your Skin Biopsy Site  Proper care of the biopsy site is essential for promoting healing and minimizing scarring.  This handout provides instructions on how to care for your biopsy site to ensure optimal recovery.  1. Cleaning the Wound:  Clean the biopsy site daily with gentle soap and water. Gently pat the area dry with a clean, soft towel. Avoid harsh scrubbing or rubbing the area, as this can irritate the skin and delay healing.  2. Applying Aquaphor and Bandage:  After cleaning the wound, apply a thin layer of Aquaphor ointment to the biopsy site. Cover the area with a sterile bandage to protect it from dirt, bacteria, and friction. Change the bandage daily or as needed if it becomes soiled or wet.  3. Continued Care for One Week:  Repeat the cleaning, Aquaphor application, and bandaging process daily for one week following the biopsy procedure. Keeping the wound clean and moist during this initial healing period will help prevent infection and promote optimal healing.  4. Massaging Aquaphor into the Area:  ---After one week, discontinue the use of bandages but continue to apply Aquaphor to the biopsy site. ----Gently massage the Aquaphor into the area using circular motions. ---Massaging the skin helps to promote circulation and prevent the formation of scar tissue.   Additional Tips:  Avoid exposing the biopsy site to direct sunlight during the healing process, as this can cause hyperpigmentation or worsen scarring. If you experience any signs of infection, such as increased redness, swelling, warmth, or drainage from the wound, contact your healthcare provider immediately. Follow any additional instructions provided by your healthcare provider for caring for the biopsy site and managing any discomfort. Conclusion:  Taking proper care of your skin biopsy  site is crucial for ensuring optimal healing and minimizing scarring. By following these instructions for cleaning, applying Aquaphor, and massaging the area, you can promote a smooth and successful recovery. If you have any questions or  concerns about caring for your biopsy site, don't hesitate to contact your healthcare provider for guidance.     Important Information  Due to recent changes in healthcare laws, you may see results of your pathology and/or laboratory studies on MyChart before the doctors have had a chance to review them. We understand that in some cases there may be results that are confusing or concerning to you. Please understand that not all results are received at the same time and often the doctors may need to interpret multiple results in order to provide you with the best plan of care or course of treatment. Therefore, we ask that you please give [place] 2 business days to thoroughly review all your results before contacting the office for clarification. Should we see a critical lab result, you will be contacted sooner.   If You Need Anything After Your Visit  If you have any questions or concerns for your doctor, please call our main line at [phone] If no one answers, please leave a voicemail as directed and we will return your call as soon as possible. Messages left after 4 pm will be answered the following business day.   You may also send [place] a message via MyChart. We typically respond to MyChart messages within 1-2 business days.  For prescription refills, please ask your pharmacy to contact our office. Our fax number is [phone].  If you have an urgent issue when the clinic is closed that cannot wait until the next business day, you can page your doctor at the number below.    Please note that while we do our best to be available for urgent issues outside of office hours, we are not available 24/7.   If you have an urgent issue and are unable to reach [place], you may choose to seek medical care at your doctor's office, retail clinic, urgent care center, or emergency room.  If you have a medical emergency, please immediately call 911 or go to the emergency department. In the event of inclement  weather, please call our main line at [phone] for an update on the status of any delays or closures.  Dermatology Medication Tips: Please keep the boxes that topical medications come in in order to help keep track of the instructions about where and how to use these. Pharmacies typically print the medication instructions only on the boxes and not directly on the medication tubes.   If your medication is too expensive, please contact our office at [phone] or send [place] a message through MyChart.   We are unable to tell what your co-pay for medications will be in advance as this is different depending on your insurance coverage. However, we may be able to find a substitute medication at lower cost or fill out paperwork to get insurance to cover a needed medication.   If a prior authorization is required to get your medication covered by your insurance company, please allow [place] 1-2 business days to complete this process.  Drug prices often vary depending on where the prescription is filled and some pharmacies may offer cheaper prices.  The website www.goodrx.com contains coupons for medications through different pharmacies. The prices here do not account for what the cost may be with help from insurance (it may be cheaper with  your insurance), but the website can give you the price if you did not use any insurance.  - You can print the associated coupon and take it with your prescription to the pharmacy.  - You may also stop by our office during regular business hours and pick up a GoodRx coupon card.  - If you need your prescription sent electronically to a different pharmacy, notify our office through [facility] or by phone at [phone]

## 2023-09-08 DIAGNOSIS — E113312 Type 2 diabetes mellitus with moderate nonproliferative diabetic retinopathy with macular edema, left eye: Secondary | ICD-10-CM | POA: Diagnosis not present

## 2023-09-11 LAB — SURGICAL PATHOLOGY

## 2023-09-12 ENCOUNTER — Encounter: Payer: Self-pay | Admitting: Dermatology

## 2023-09-12 NOTE — Progress Notes (Signed)
Hi Shirron,  Please call pt and notify that their bx results showed an abnormal mole that requires a full excision in office with Dr Caralyn Guile

## 2023-09-13 ENCOUNTER — Telehealth: Payer: Self-pay

## 2023-09-13 DIAGNOSIS — D229 Melanocytic nevi, unspecified: Secondary | ICD-10-CM

## 2023-09-13 HISTORY — DX: Melanocytic nevi, unspecified: D22.9

## 2023-09-13 NOTE — Telephone Encounter (Signed)
-----   Message from Langston Reusing sent at 09/12/2023  3:22 PM EST ----- Hi Lorana Maffeo,  Please call pt and notify that their bx results showed an abnormal mole that requires a full excision in office with Dr Caralyn Guile

## 2023-09-13 NOTE — Telephone Encounter (Signed)
Pt scheduled for PACI, MD 09/28/2023 at 9:45 AM

## 2023-09-14 LAB — QUANTIFERON-TB GOLD PLUS
QuantiFERON Mitogen Value: >10 [IU]/mL
QuantiFERON Nil Value: 0 [IU]/mL
QuantiFERON TB1 Ag Value: 0 [IU]/mL
QuantiFERON TB2 Ag Value: 0 [IU]/mL
QuantiFERON-TB Gold Plus: NEGATIVE

## 2023-09-14 LAB — ACUTE HEP PANEL AND HEP B SURFACE AB
Hep A IgM: NEGATIVE
Hep B C IgM: NEGATIVE
Hep C Virus Ab: REACTIVE — AB
Hepatitis B Surf Ab Quant: 22.5 m[IU]/mL
Hepatitis B Surface Ag: NEGATIVE

## 2023-09-19 ENCOUNTER — Ambulatory Visit
Admission: RE | Admit: 2023-09-19 | Discharge: 2023-09-19 | Disposition: A | Discharge status: Home / Self Care | Payer: BC Managed Care – PPO | Source: Ambulatory Visit | Attending: Family Medicine | Admitting: Family Medicine

## 2023-09-19 ENCOUNTER — Telehealth: Payer: Self-pay

## 2023-09-19 ENCOUNTER — Emergency Department (HOSPITAL_COMMUNITY)
Admission: EM | Admit: 2023-09-19 | Discharge: 2023-09-19 | Disposition: A | Discharge status: Home / Self Care | Payer: BC Managed Care – PPO | Attending: Emergency Medicine | Admitting: Emergency Medicine

## 2023-09-19 ENCOUNTER — Emergency Department (HOSPITAL_COMMUNITY): Payer: BC Managed Care – PPO

## 2023-09-19 VITALS — BP 148/77 | HR 107 | Temp 100.2°F | Resp 20

## 2023-09-19 DIAGNOSIS — I2510 Atherosclerotic heart disease of native coronary artery without angina pectoris: Secondary | ICD-10-CM | POA: Diagnosis not present

## 2023-09-19 DIAGNOSIS — R0602 Shortness of breath: Secondary | ICD-10-CM | POA: Diagnosis not present

## 2023-09-19 DIAGNOSIS — J111 Influenza due to unidentified influenza virus with other respiratory manifestations: Secondary | ICD-10-CM | POA: Diagnosis not present

## 2023-09-19 DIAGNOSIS — R739 Hyperglycemia, unspecified: Secondary | ICD-10-CM | POA: Diagnosis not present

## 2023-09-19 DIAGNOSIS — J9600 Acute respiratory failure, unspecified whether with hypoxia or hypercapnia: Secondary | ICD-10-CM | POA: Diagnosis not present

## 2023-09-19 DIAGNOSIS — J9601 Acute respiratory failure with hypoxia: Secondary | ICD-10-CM

## 2023-09-19 DIAGNOSIS — E119 Type 2 diabetes mellitus without complications: Secondary | ICD-10-CM | POA: Diagnosis not present

## 2023-09-19 DIAGNOSIS — J101 Influenza due to other identified influenza virus with other respiratory manifestations: Secondary | ICD-10-CM

## 2023-09-19 DIAGNOSIS — Z7982 Long term (current) use of aspirin: Secondary | ICD-10-CM | POA: Insufficient documentation

## 2023-09-19 DIAGNOSIS — R918 Other nonspecific abnormal finding of lung field: Secondary | ICD-10-CM | POA: Diagnosis not present

## 2023-09-19 DIAGNOSIS — R059 Cough, unspecified: Secondary | ICD-10-CM | POA: Diagnosis not present

## 2023-09-19 DIAGNOSIS — Z794 Long term (current) use of insulin: Secondary | ICD-10-CM | POA: Diagnosis not present

## 2023-09-19 DIAGNOSIS — R509 Fever, unspecified: Secondary | ICD-10-CM | POA: Diagnosis not present

## 2023-09-19 LAB — POC COVID19/FLU A&B COMBO
Covid Antigen, POC: NEGATIVE
Influenza A Antigen, POC: POSITIVE — AB
Influenza B Antigen, POC: NEGATIVE

## 2023-09-19 MED ORDER — IPRATROPIUM-ALBUTEROL 0.5-2.5 (3) MG/3ML IN SOLN
3.0000 mL | Freq: Once | RESPIRATORY_TRACT | Status: AC
  Administered 2023-09-19: 3 mL via RESPIRATORY_TRACT
  Filled 2023-09-19: qty 3

## 2023-09-19 MED ORDER — ACETAMINOPHEN 500 MG PO TABS: 1000.0000 mg | ORAL_TABLET | Freq: Once | ORAL | Status: DC

## 2023-09-19 MED ORDER — ACETAMINOPHEN 325 MG PO TABS: 650.0000 mg | ORAL_TABLET | ORAL | 0 refills | Status: DC | PRN

## 2023-09-19 MED ORDER — IBUPROFEN 800 MG PO TABS
800.0000 mg | ORAL_TABLET | Freq: Once | ORAL | Status: AC
  Administered 2023-09-19: 800 mg via ORAL
  Filled 2023-09-19: qty 1

## 2023-09-19 MED ORDER — GUAIFENESIN 100 MG/5ML PO LIQD: 100.0000 mg | ORAL | 0 refills | Status: DC | PRN

## 2023-09-19 MED ORDER — BENZONATATE 100 MG PO CAPS: 100.0000 mg | ORAL_CAPSULE | Freq: Three times a day (TID) | ORAL | 0 refills | Status: DC

## 2023-09-19 MED ORDER — ACETAMINOPHEN 325 MG PO TABS
650.0000 mg | ORAL_TABLET | Freq: Once | ORAL | Status: AC
  Administered 2023-09-19: 650 mg via ORAL

## 2023-09-19 NOTE — Telephone Encounter (Signed)
-----   Message from Langston Reusing sent at 09/19/2023  8:43 AM EST ----- Fernando Nelson,  Please call patient and let him know that the screening test we did for TB and Hepatitis exposure in preparation of possibly starting a biologic for his psoriasis (we started him on Otezla at his last visit)  The hepatitis c screening came back positive.  Fortunately there is treatment for this however he will need to follow up with a gastroenterologist for treatment.  If left untreated it can cause life threatening liver damage.  Please send a referral to [facility] GI Dr. Allegra Lai.  Also remind him to cut out alcohol from his lifestyle if he's a social or regular drinker.    He can continue to take [facility] for his psoriasis for now  Thanks!

## 2023-09-19 NOTE — ED Notes (Signed)
Pt resting comfortably-aware of waiting for EMS-states wife dropped him off and is en route back to clinic

## 2023-09-19 NOTE — Telephone Encounter (Signed)
LVMTCB also sent Mychart message

## 2023-09-19 NOTE — ED Triage Notes (Signed)
Pt here from UC with c/o sob and requiring O2 due sats of 89 %  swabbed positive for flu today

## 2023-09-19 NOTE — ED Notes (Signed)
Patient is being discharged from the Urgent Care and sent to the Emergency Department via [facility] . Per Urban Gibson, PA-C, patient is in need of higher level of care due to hypoxia. Patient is aware and verbalizes understanding of plan of care.  Vitals:   09/19/23 0827 09/19/23 0831  BP:    Pulse:  (!) 107  Resp:  20  Temp:    SpO2: (!) 84% 98%

## 2023-09-19 NOTE — ED Provider Triage Note (Signed)
Emergency Medicine Provider Triage Evaluation Note  Fernando Nelson , a 63 y.o. male  was evaluated in triage.  Pt complains of flu.  Review of Systems  Positive: SOB, cough, congestion, abdominal pain, NV Negative: CP  Physical Exam  BP (!) 141/64 (BP Location: Left Arm)   Pulse (!) 109   Temp (!) 100.4 F (38 C) (Oral)   Resp 16   SpO2 94%  Gen:   Awake, no distress   Resp:  Normal effort  MSK:   Moves extremities without difficulty  Other:    Medical Decision Making  Medically screening exam initiated at 9:31 AM.  Appropriate orders placed.  Fernando Nelson was informed that the remainder of the evaluation will be completed by another provider, this initial triage assessment does not replace that evaluation, and the importance of remaining in the ED until their evaluation is complete.  Hypoxic with ambulation at 84%, no oxygen at home, flu positive. No COPD or asthma. DuoNeb and Tylenol in triage. On RA 94% right now. Sent from [employer], [place] 09/19/23 [phone]

## 2023-09-19 NOTE — ED Provider Notes (Signed)
[facility] - URGENT CARE CENTER  Note:  This document was prepared using Conservation officer, historic buildings and may include unintentional dictation errors.  MRN: [id number] DOB: [birth year]  Subjective:   Fernando Nelson is a 63 y.o. male presenting for 3 day history of acute onset fever, sinus congestion, coughing, decreased appetite, nausea, vomiting, malaise and fatigue.  Patient is a type II diabetic on insulin.  No history of asthma, respiratory conditions.  No smoking of any kind.  No current facility-administered medications for this encounter.  Current Outpatient Medications:    albuterol (VENTOLIN HFA) 108 (90 Base) MCG/ACT inhaler, Inhale 2 puffs into the lungs every 6 (six) hours as needed for wheezing or shortness of breath., Disp: 8 g, Rfl: 2   aspirin EC 81 MG tablet, Take 81 mg by mouth daily., Disp: , Rfl:    atorvastatin (LIPITOR) 80 MG tablet, Take 1 tablet (80 mg total) by mouth daily., Disp: 90 tablet, Rfl: 3   insulin lispro (HUMALOG KWIKPEN) 200 UNIT/ML KwikPen, Inject 20-25 Units into the skin in the morning, at noon, and at bedtime. Depending on BS reading, Disp: , Rfl:    losartan (COZAAR) 50 MG tablet, Take 50 mg by mouth daily., Disp: , Rfl:    metoprolol tartrate (LOPRESSOR) 25 MG tablet, TAKE 1 TABLET BY MOUTH TWICE DAILY. (Patient taking differently: Take 25 mg by mouth 2 (two) times daily.), Disp: 180 tablet, Rfl: 3   Multiple Vitamin (MULTIVITAMIN WITH MINERALS) TABS, Take 1 tablet by mouth daily., Disp: , Rfl:    pantoprazole (PROTONIX) 40 MG tablet, Take 40 mg by mouth daily., Disp: , Rfl:    TOUJEO SOLOSTAR 300 UNIT/ML Solostar Pen, Inject 60 Units into the skin daily., Disp: , Rfl:    TRULICITY 1.5 MG/0.5ML SOPN, Inject 1.5 mg into the skin once a week., Disp: , Rfl:    acetaminophen (TYLENOL) 325 MG tablet, Take 650 mg by mouth as needed for moderate pain., Disp: , Rfl:    Apremilast (OTEZLA) 30 MG TABS, Take 1 tablet (30 mg total) by mouth 2 (two)  times daily., Disp: 60 tablet, Rfl: 11   clobetasol (TEMOVATE) 0.05 % external solution, Apply topically 2 (two) times daily., Disp: , Rfl:    clobetasol cream (TEMOVATE) 0.05 %, Apply 1 Application topically in the morning and at bedtime., Disp: , Rfl:    guaiFENesin 200 MG tablet, Take 1 tablet (200 mg total) by mouth every 4 (four) hours as needed for cough or to loosen phlegm., Disp: 30 suppository, Rfl: 0   icosapent Ethyl (VASCEPA) 1 g capsule, Take 2 capsules (2 g total) by mouth 2 (two) times daily., Disp: 360 capsule, Rfl: 2   nitroGLYCERIN (NITROSTAT) 0.4 MG SL tablet, Place 1 tablet (0.4 mg total) under the tongue every 5 (five) minutes x 3 doses as needed for chest pain., Disp: 25 tablet, Rfl: 6   ondansetron (ZOFRAN-ODT) 8 MG disintegrating tablet, Take 1 tablet (8 mg total) by mouth every 8 (eight) hours as needed for nausea or vomiting. (Patient taking differently: Take 8 mg by mouth as needed for nausea or vomiting.), Disp: 20 tablet, Rfl: 0   tacrolimus (PROTOPIC) 0.1 % ointment, Apply topically 2 (two) times daily. Use for 2 weeks then alternate use with clobetasol., Disp: 100 g, Rfl: 0   No Known Allergies  Past Medical History:  Diagnosis Date   Always tired    Atypical nevus 09/13/2023   Left lower back - excision needed    Back  pain    Bilateral swelling of feet and ankles    CAD (coronary artery disease)    a. [place] 02/2012 - s/p PTCA/Evolve II study stent to RCA 03/23/12, residual LAD dz   Chest pain    Erectile dysfunction    Hearing loss    Hepatitis B ~ 2000   "donated blood to [employer]; got letter; had shots over 6 months; gone now"   Hyperlipemia    Hypertension    Shortness of breath    Swallowing difficulty    Type II diabetes mellitus (HCC)      Past Surgical History:  Procedure Laterality Date   CORONARY ANGIOPLASTY WITH STENT PLACEMENT  03/23/12   "1; first one ever"   LEFT HEART CATHETERIZATION WITH CORONARY ANGIOGRAM N/A 03/23/2012   Procedure: LEFT  HEART CATHETERIZATION WITH CORONARY ANGIOGRAM;  Surgeon: Dolores Patty, MD;  Location: [facility] CATH LAB;  Service: Cardiovascular;  Laterality: N/A;   PERCUTANEOUS CORONARY STENT INTERVENTION (PCI-S)  03/23/2012   Procedure: PERCUTANEOUS CORONARY STENT INTERVENTION (PCI-S);  Surgeon: Dolores Patty, MD;  Location: [facility] CATH LAB;  Service: Cardiovascular;;   SHOULDER OPEN ROTATOR CUFF REPAIR  2005   left   VASECTOMY      Family History  Problem Relation Age of Onset   Heart failure Mother    Diabetes Mother    Heart disease Mother    Sudden death Mother    Obesity Mother    Hypertension Father    Hyperlipidemia Father    Heart disease Father    Sudden death Father    Cancer Father    Obesity Father     Social History   Tobacco Use   Smoking status: Former    Current packs/day: 0.00    Average packs/day: 1 pack/day for 25.0 years (25.0 ttl pk-yrs)    Types: Cigarettes    Start date: 09/22/1981    Quit date: 09/22/2006    Years since quitting: 17.0   Smokeless tobacco: Never  Substance Use Topics   Alcohol use: No   Drug use: No    Types: Cocaine, Marijuana, Heroin, Hashish    Comment: 03/23/12 "last drug use was in my teens"    ROS   Objective:   Vitals: BP (!) 148/77 (BP Location: Left Arm)   Pulse (!) 111   Temp 100.2 F (37.9 C) (Oral)   Resp 18   SpO2 (!) 84%   Patient placed on 10 L of oxygen using a nonrebreather mask.  Physical Exam Constitutional:      General: He is not in acute distress.    Appearance: Normal appearance. He is well-developed. He is not ill-appearing, toxic-appearing or diaphoretic.  HENT:     Head: Normocephalic and atraumatic.     Right Ear: External ear normal.     Left Ear: External ear normal.     Nose: Nose normal.     Mouth/Throat:     Mouth: Mucous membranes are moist.  Eyes:     General: No scleral icterus.       Right eye: No discharge.        Left eye: No discharge.     Extraocular Movements: Extraocular movements  intact.  Cardiovascular:     Rate and Rhythm: Regular rhythm. Tachycardia present.     Heart sounds: Normal heart sounds. No murmur heard.    No friction rub. No gallop.  Pulmonary:     Effort: Pulmonary effort is normal. No respiratory distress.  Breath sounds: No stridor. No wheezing, rhonchi or rales.     Comments: Decreased lung sounds throughout. Neurological:     Mental Status: He is alert and oriented to person, place, and time.  Psychiatric:        Mood and Affect: Mood normal.        Behavior: Behavior normal.        Thought Content: Thought content normal.     Results for orders placed or performed during the hospital encounter of 09/19/23 (from the past 24 hours)  POC Covid19/Flu A&B Antigen     Status: Abnormal   Collection Time: 09/19/23  8:30 AM  Result Value Ref Range   Influenza A Antigen, POC Positive (A) Negative   Influenza B Antigen, POC Negative Negative   Covid Antigen, POC Negative Negative   Patient placed on 10L of oxygen using a non-rebreather mask which improved his pulse oximetry to 97%.  Assessment and Plan :   PDMP not reviewed this encounter.  1. Acute respiratory failure with hypoxia [facility])    Patient is in need of a higher level of care than we can provide in the urgent care setting.  Of primary concern is signs of respiratory failure secondary to suspected lower respiratory infection, sepsis, viral or bacterial pneumonia.  Also possible is pulmonary embolism.  Patient is to be transported by EMS to the hospital for further intervention and a higher level of care.  Case reported, transfer of care completed.   Wallis Bamberg, PA-C 09/19/23 1051

## 2023-09-19 NOTE — ED Notes (Signed)
Pt placed on O2 NRB sats at 97%

## 2023-09-19 NOTE — Discharge Instructions (Addendum)
You have been evaluated for your symptoms.  You have test positive for influenza A.  Fortunately your chest x-ray did not show pneumonia.  Please take Tylenol or ibuprofen as needed for fever and bodyaches, stay hydrated by drinking plenty of fluid.  Take cough medication as prescribed.  Your symptoms may last for 1 to 2 weeks.  Return if you have any concern.

## 2023-09-19 NOTE — Progress Notes (Signed)
Hi Jetta,  Please call patient and let him know that the screening test we did for TB and Hepatitis exposure in preparation of possibly starting a biologic for his psoriasis (we started him on Otezla at his last visit)  The hepatitis c screening came back positive.  Fortunately there is treatment for this however he will need to follow up with a gastroenterologist for treatment.  If left untreated it can cause life threatening liver damage.  Please send a referral to [facility] GI Dr. Allegra Lai.  Also remind him to cut out alcohol from his lifestyle if he's a social or regular drinker.    He can continue to take [facility] for his psoriasis for now  Thanks!

## 2023-09-19 NOTE — ED Provider Notes (Signed)
[facility]ERGENCY DEPARTMENT AT [facility] Provider Note   CSN: [id number] Arrival date & time: 09/19/23  4098     History  Chief Complaint  Patient presents with   Shortness of Breath    Fernando Nelson is a 63 y.o. male.  The history is provided by the patient, a parent and medical records. No language interpreter was used.  Shortness of Breath    63 year old male history of insulin-dependent diabetes, hepatitis B, CAD sent here from urgent care center with concerns of flulike symptoms.  Patient report for the past 4 days he has had fever, chills, congestion, throat irritation, coughing, decreased appetite, nauseous, vomiting, feeling fatigued, and lack of energy.  He tries taking over-the-counter medication at home without adequate relief.  He was seen at urgent care center today but was noted to be hypoxic and sent to the ER for further assessment.  He denies any significant shortness of breath.  Denies any history of asthma or COPD.  No prior history of PE or DVT.  He took Tylenol about 3 hours ago.  Home Medications Prior to Admission medications   Medication Sig Start Date End Date Taking? Authorizing Provider  acetaminophen (TYLENOL) 325 MG tablet Take 650 mg by mouth as needed for moderate pain.    [provider]  albuterol (VENTOLIN HFA) 108 (90 Base) MCG/ACT inhaler Inhale 2 puffs into the lungs every 6 (six) hours as needed for wheezing or shortness of breath. 02/26/23   Charolotte Eke, MD  Apremilast (OTEZLA) 30 MG TABS Take 1 tablet (30 mg total) by mouth 2 (two) times daily. 09/07/23   Terri Piedra, DO  aspirin EC 81 MG tablet Take 81 mg by mouth daily.    [provider]  atorvastatin (LIPITOR) 80 MG tablet Take 1 tablet (80 mg total) by mouth daily. 03/19/21   Nahser, Deloris Ping, MD  clobetasol (TEMOVATE) 0.05 % external solution Apply topically 2 (two) times daily. 01/10/23   [provider]  clobetasol cream (TEMOVATE) 0.05 %  Apply 1 Application topically in the morning and at bedtime. 02/01/23   [provider]  guaiFENesin 200 MG tablet Take 1 tablet (200 mg total) by mouth every 4 (four) hours as needed for cough or to loosen phlegm. 02/26/23   Charolotte Eke, MD  icosapent Ethyl (VASCEPA) 1 g capsule Take 2 capsules (2 g total) by mouth 2 (two) times daily. 06/30/23   Nahser, Deloris Ping, MD  insulin lispro (HUMALOG KWIKPEN) 200 UNIT/ML KwikPen Inject 20-25 Units into the skin in the morning, at noon, and at bedtime. Depending on BS reading    [provider]  losartan (COZAAR) 50 MG tablet Take 50 mg by mouth daily. 01/13/23   [provider]  metoprolol tartrate (LOPRESSOR) 25 MG tablet TAKE 1 TABLET BY MOUTH TWICE DAILY. Patient taking differently: Take 25 mg by mouth 2 (two) times daily. 02/17/22   Nahser, Deloris Ping, MD  Multiple Vitamin (MULTIVITAMIN WITH MINERALS) TABS Take 1 tablet by mouth daily.    [provider]  nitroGLYCERIN (NITROSTAT) 0.4 MG SL tablet Place 1 tablet (0.4 mg total) under the tongue every 5 (five) minutes x 3 doses as needed for chest pain. 12/27/19   Nahser, Deloris Ping, MD  ondansetron (ZOFRAN-ODT) 8 MG disintegrating tablet Take 1 tablet (8 mg total) by mouth every 8 (eight) hours as needed for nausea or vomiting. Patient taking differently: Take 8 mg by mouth as needed for nausea  or vomiting. 11/05/22   Wallis Bamberg, PA-C  pantoprazole (PROTONIX) 40 MG tablet Take 40 mg by mouth daily. 02/02/23   [provider]  tacrolimus (PROTOPIC) 0.1 % ointment Apply topically 2 (two) times daily. Use for 2 weeks then alternate use with clobetasol. 09/07/23   Terri Piedra, DO  TOUJEO SOLOSTAR 300 UNIT/ML Solostar Pen Inject 60 Units into the skin daily. 12/02/19   [provider]  TRULICITY 1.5 MG/0.5ML SOPN Inject 1.5 mg into the skin once a week. 05/23/22   [provider]      Allergies    Patient has no known allergies.    Review of Systems    Review of Systems  Respiratory:  Positive for shortness of breath.   All other systems reviewed and are negative.   Physical Exam Updated Vital Signs BP (!) 141/64 (BP Location: Left Arm)   Pulse (!) 109   Temp (!) 100.4 F (38 C) (Oral)   Resp 16   SpO2 94%  Physical Exam Vitals and nursing note reviewed.  Constitutional:      General: He is not in acute distress.    Appearance: He is well-developed.     Comments: Patient is well-appearing sitting in the chair in no acute discomfort.  He is not wearing any supplemental oxygen, O2 sats on the monitor is at 94% on room air with good waveform.  HENT:     Head: Atraumatic.  Eyes:     Conjunctiva/sclera: Conjunctivae normal.  Cardiovascular:     Rate and Rhythm: Tachycardia present.  Pulmonary:     Effort: Pulmonary effort is normal.     Breath sounds: Normal breath sounds. No decreased breath sounds, wheezing, rhonchi or rales.  Chest:     Chest wall: No tenderness.  Musculoskeletal:     Cervical back: Neck supple.     Right lower leg: No edema.     Left lower leg: No edema.  Skin:    Findings: No rash.  Neurological:     Mental Status: He is alert and oriented to person, place, and time.     ED Results / Procedures / Treatments   Labs (all labs ordered are listed, but only abnormal results are displayed) Labs Reviewed - No data to display  EKG EKG Interpretation Date/Time:  Tuesday September 19 2023 09:33:50 EST Ventricular Rate:  109 PR Interval:  129 QRS Duration:  89 QT Interval:  318 QTC Calculation: 429 R Axis:   -18  Text Interpretation: Sinus tachycardia Left ventricular hypertrophy Borderline T abnormalities, inferior leads increased rate and axis change from prior  7/24 Confirmed by Meridee Score [phone]) on 09/19/2023 9:36:12 AM  Radiology DG Chest 2 View Result Date: 09/19/2023 CLINICAL DATA:  Shortness of breath.  Requiring oxygen.  Flu. EXAM: CHEST - 2 VIEW COMPARISON:  02/22/2023 FINDINGS:  Again noted are prominent interstitial lung markings with slightly increased densities in the left lung concerning for an acute infectious or inflammatory process. Heart size is stable. Trachea is midline. No large pleural effusions. No acute bone abnormality. IMPRESSION: Increased interstitial lung markings particularly in left lung. Findings could represent asymmetric edema versus atypical infection. Electronically Signed   By: Richarda Overlie M.D.   On: 09/19/2023 10:37    Procedures Procedures    Medications Ordered in ED Medications  ipratropium-albuterol (DUONEB) 0.5-2.5 (3) MG/3ML nebulizer solution 3 mL (3 mLs Nebulization Given 09/19/23 1005)  ibuprofen (ADVIL) tablet 800 mg (800 mg Oral Given 09/19/23 1005)  ED Course/ Medical Decision Making/ A&P                                 Medical Decision Making Amount and/or Complexity of Data Reviewed Radiology: ordered.   BP (!) 141/64 (BP Location: Left Arm)   Pulse (!) 109   Temp (!) 100.4 F (38 C) (Oral)   Resp 16   SpO2 94%   15:74 PM  63 year old male history of insulin-dependent diabetes, hepatitis B, CAD sent here from urgent care center with concerns of flulike symptoms.  Patient report for the past 4 days he has had fever, chills, congestion, throat irritation, coughing, decreased appetite, nauseous, vomiting, feeling fatigued, and lack of energy.  He tries taking over-the-counter medication at home without adequate relief.  He was seen at urgent care center today but was noted to be hypoxic and sent to the ER for further assessment.  He denies any significant shortness of breath.  Denies any history of asthma or COPD.  No prior history of PE or DVT.  He took Tylenol about 3 hours ago.  Exam overall reassuring, patient is well-appearing, with normal O2 on room air, lungs otherwise clear to auscultation bilaterally no peripheral edema noted.  Strength is equal throughout.  Skin is warm to the touch.  Vital signs notable for  oral temperature of 100.4, patient is now tachycardic with a heart rate of 109, no hypoxia.  EKG shows sinus tachycardia.  EMR reviewed, patient had a documented O2 sats of 84% at urgent care center earlier today.  Patient also test positive for influenza A from his visit.  Chest x-ray obtained independent viewed interpreted by me showing some interstitial lung markings suggestive of atypical infection, consistent with his presentation.  Will have patient ambulate and check O2 to determine if he meets criteria for admission.  Ibuprofen given for fever and pain.  2:08 PM Fortunately on ambulation, patient maintain O2 above 90%.  Patient will be discharged home with symptom control and outpatient follow-up.  Patient is outside the window for Tamiflu.  Return precaution given.        Final Clinical Impression(s) / ED Diagnoses Final diagnoses:  Influenza A    Rx / DC Orders ED Discharge Orders          Ordered    benzonatate (TESSALON) 100 MG capsule  Every 8 hours        09/19/23 1411    guaiFENesin (ROBITUSSIN) 100 MG/5ML liquid  Every 4 hours PRN        09/19/23 1411    acetaminophen (TYLENOL) 325 MG tablet  As needed        09/19/23 1411              Fayrene Helper, PA-C 09/19/23 1412    Bethann Berkshire, MD 09/21/23 [phone]

## 2023-09-19 NOTE — ED Triage Notes (Addendum)
Cough.fever.nasal congestion and vomiting x 3 days. Patient states he is not able to keep anything on his stomach.

## 2023-09-25 DIAGNOSIS — R946 Abnormal results of thyroid function studies: Secondary | ICD-10-CM | POA: Diagnosis not present

## 2023-09-25 DIAGNOSIS — R0602 Shortness of breath: Secondary | ICD-10-CM | POA: Diagnosis not present

## 2023-09-25 DIAGNOSIS — G9331 Postviral fatigue syndrome: Secondary | ICD-10-CM | POA: Diagnosis not present

## 2023-09-25 DIAGNOSIS — R5383 Other fatigue: Secondary | ICD-10-CM | POA: Diagnosis not present

## 2023-09-27 ENCOUNTER — Encounter: Payer: Self-pay | Admitting: Dermatology

## 2023-09-28 ENCOUNTER — Encounter: Payer: Self-pay | Admitting: Dermatology

## 2023-09-28 ENCOUNTER — Ambulatory Visit: Payer: BC Managed Care – PPO | Admitting: Dermatology

## 2023-09-28 VITALS — BP 134/80 | HR 80 | Temp 97.8°F

## 2023-09-28 DIAGNOSIS — D235 Other benign neoplasm of skin of trunk: Secondary | ICD-10-CM

## 2023-09-28 DIAGNOSIS — D239 Other benign neoplasm of skin, unspecified: Secondary | ICD-10-CM

## 2023-09-28 DIAGNOSIS — L988 Other specified disorders of the skin and subcutaneous tissue: Secondary | ICD-10-CM | POA: Diagnosis not present

## 2023-09-28 NOTE — Progress Notes (Signed)
 Follow-Up Visit   Subjective  Fernando Nelson is a 63 y.o. 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 Alm.   The following portions of the chart were reviewed this encounter and updated as appropriate: medications, allergies, medical history  Review of Systems:  No other skin or systemic complaints except as noted in HPI or Assessment and Plan.  Objective  Well appearing patient in no apparent distress; mood and affect are within normal limits.  A focused examination was performed of the following areas: Left lower back Relevant physical exam findings are noted in the Assessment and Plan.   Left Lower Back Tan-brown and/or pink-flesh-colored symmetric macules and papules.    Assessment & Plan   DYSPLASTIC NEVUS Left Lower Back Skin excision - Left Lower Back  Excision method:  elliptical Lesion length (cm):  2.1 Lesion width (cm):  1.2 Margin per side (cm):  0.5 Total excision diameter (cm):  3.1 Informed consent: discussed and consent obtained   Timeout: patient name, date of birth, surgical site, and procedure verified   Procedure prep:  Patient was prepped and draped in usual sterile fashion Prep type:  Chlorhexidine Anesthesia: the lesion was anesthetized in a standard fashion   Anesthetic:  1% lidocaine  w/ epinephrine 1-100,000 buffered w/ 8.4% NaHCO3 Instrument used: #15 blade   Hemostasis achieved with: suture, pressure and electrodesiccation   Outcome: patient tolerated procedure well with no complications   Post-procedure details: sterile dressing applied and wound care instructions given   Dressing type: bandage, petrolatum and pressure dressing    Skin repair - Left Lower Back Complexity:  Complex Final length (cm):  6.5 Informed consent: discussed and consent obtained   Timeout: patient name, date of birth, surgical site, and procedure verified   Procedure prep:  Patient was prepped and draped in usual  sterile fashion Prep type:  Chlorhexidine Anesthesia: the lesion was anesthetized in a standard fashion   Anesthetic:  1% lidocaine  w/ epinephrine 1-100,000 buffered w/ 8.4% NaHCO3 Reason for type of repair: reduce tension to allow closure and preserve normal anatomy   Undermining: area extensively undermined   Subcutaneous layers (deep stitches):  Suture size:  3-0 Suture type: PDS (polydioxanone)   Stitches:  Buried vertical mattress Fine/surface layer approximation (top stitches):  Suture type: cyanoacrylate tissue glue   Suture removal (days):  0 Hemostasis achieved with: suture, pressure and electrodesiccation Outcome: patient tolerated procedure well with no complications   Post-procedure details: sterile dressing applied and wound care instructions given   Dressing type: bandage, pressure dressing and petrolatum    The surgical wound was then cleaned, prepped, and re-anesthetized as above. Wound edges were undermined extensively along at least one entire edge and at a distance equal to or greater than the width of the defect (see wound defect size above) in order to achieve closure and decrease wound tension and anatomic distortion. Redundant tissue repair including standing cone removal was performed. Hemostasis was achieved with electrocautery. Subcutaneous and epidermal tissues were approximated with the above sutures. The surgical site was then lightly scrubbed with sterile, saline-soaked gauze. Steri-strips were applied, and the area was then bandaged using Vaseline ointment, non-adherent gauze, gauze pads, and tape to provide an adequate pressure dressing. The patient tolerated the procedure well, was given detailed written and verbal wound care instructions, and was discharged in good condition.   The patient will follow-up: PRN.  Return if symptoms worsen or fail to improve.  LILLETTE Berwyn Baseman, Surg Tech III, am acting as scribe for RUFUS CHRISTELLA HOLY, MD.   Documentation: I have  reviewed the above documentation for accuracy and completeness, and I agree with the above.  RUFUS CHRISTELLA HOLY, MD

## 2023-09-28 NOTE — Telephone Encounter (Signed)
 Ex done 09/28/23

## 2023-09-29 ENCOUNTER — Encounter: Payer: Self-pay | Admitting: Dermatology

## 2023-10-02 LAB — SURGICAL PATHOLOGY

## 2023-10-03 ENCOUNTER — Telehealth: Payer: Self-pay

## 2023-10-03 NOTE — Telephone Encounter (Signed)
-----  Message from Langston Reusing sent at 09/19/2023  8:43 AM EST ----- Murlean Caller,  Please call patient and let him know that the screening test we did for TB and Hepatitis exposure in preparation of possibly starting a biologic for his psoriasis (we started him on Otezla at his last visit)  The hepatitis c screening came back positive.  Fortunately there is treatment for this however he will need to follow up with a gastroenterologist for treatment.  If left untreated it can cause life threatening liver damage.  Please send a referral to [facility] GI Dr. Allegra Lai.  Also remind him to cut out alcohol from his lifestyle if he's a social or regular drinker.    He can continue to take [facility] for his psoriasis for now  Thanks!

## 2023-10-03 NOTE — Telephone Encounter (Signed)
Spoke with pt and informed him that his TB results are negative but he tested positive for Hep C and will need to undergo treatment as he is continuing [place]. Pt stated he is currently established with [facility] so he will reach out to schedule an appt. I advised to contact our office with his appt details once scheduled. Pt voiced his understanding.

## 2023-10-16 DIAGNOSIS — L03312 Cellulitis of back [any part except buttock]: Secondary | ICD-10-CM | POA: Diagnosis not present

## 2023-10-18 DIAGNOSIS — E113312 Type 2 diabetes mellitus with moderate nonproliferative diabetic retinopathy with macular edema, left eye: Secondary | ICD-10-CM | POA: Diagnosis not present

## 2023-10-20 ENCOUNTER — Other Ambulatory Visit: Payer: Self-pay

## 2023-10-20 MED ORDER — METOPROLOL TARTRATE 25 MG PO TABS: ORAL_TABLET | ORAL | 1 refills | Status: DC

## 2023-11-17 DIAGNOSIS — E113312 Type 2 diabetes mellitus with moderate nonproliferative diabetic retinopathy with macular edema, left eye: Secondary | ICD-10-CM | POA: Diagnosis not present

## 2023-12-21 DIAGNOSIS — M25552 Pain in left hip: Secondary | ICD-10-CM | POA: Diagnosis not present

## 2023-12-26 DIAGNOSIS — M25552 Pain in left hip: Secondary | ICD-10-CM | POA: Diagnosis not present

## 2024-01-04 DIAGNOSIS — M7062 Trochanteric bursitis, left hip: Secondary | ICD-10-CM | POA: Diagnosis not present

## 2024-01-11 ENCOUNTER — Ambulatory Visit: Payer: BC Managed Care – PPO | Admitting: Dermatology

## 2024-01-22 ENCOUNTER — Ambulatory Visit: Admitting: Dermatology

## 2024-01-25 DIAGNOSIS — M7062 Trochanteric bursitis, left hip: Secondary | ICD-10-CM | POA: Diagnosis not present

## 2024-02-01 ENCOUNTER — Encounter: Payer: Self-pay | Admitting: Cardiovascular Disease

## 2024-02-26 DIAGNOSIS — I2510 Atherosclerotic heart disease of native coronary artery without angina pectoris: Secondary | ICD-10-CM | POA: Diagnosis not present

## 2024-02-26 DIAGNOSIS — R946 Abnormal results of thyroid function studies: Secondary | ICD-10-CM | POA: Diagnosis not present

## 2024-02-26 DIAGNOSIS — E1165 Type 2 diabetes mellitus with hyperglycemia: Secondary | ICD-10-CM | POA: Diagnosis not present

## 2024-02-26 DIAGNOSIS — R809 Proteinuria, unspecified: Secondary | ICD-10-CM | POA: Diagnosis not present

## 2024-02-26 DIAGNOSIS — E559 Vitamin D deficiency, unspecified: Secondary | ICD-10-CM | POA: Diagnosis not present

## 2024-02-26 DIAGNOSIS — E782 Mixed hyperlipidemia: Secondary | ICD-10-CM | POA: Diagnosis not present

## 2024-03-06 DIAGNOSIS — E113312 Type 2 diabetes mellitus with moderate nonproliferative diabetic retinopathy with macular edema, left eye: Secondary | ICD-10-CM | POA: Diagnosis not present

## 2024-03-11 DIAGNOSIS — M7062 Trochanteric bursitis, left hip: Secondary | ICD-10-CM | POA: Diagnosis not present

## 2024-03-18 ENCOUNTER — Ambulatory Visit: Attending: Cardiology | Admitting: Cardiology

## 2024-03-18 ENCOUNTER — Encounter: Payer: Self-pay | Admitting: Cardiology

## 2024-03-18 ENCOUNTER — Other Ambulatory Visit: Payer: Self-pay

## 2024-03-18 VITALS — BP 135/78 | HR 90 | Resp 16 | Ht 67.0 in | Wt 249.2 lb

## 2024-03-18 DIAGNOSIS — E66812 Obesity, class 2: Secondary | ICD-10-CM | POA: Diagnosis not present

## 2024-03-18 DIAGNOSIS — E1165 Type 2 diabetes mellitus with hyperglycemia: Secondary | ICD-10-CM | POA: Diagnosis not present

## 2024-03-18 DIAGNOSIS — Z6839 Body mass index (BMI) 39.0-39.9, adult: Secondary | ICD-10-CM

## 2024-03-18 DIAGNOSIS — I2510 Atherosclerotic heart disease of native coronary artery without angina pectoris: Secondary | ICD-10-CM | POA: Diagnosis not present

## 2024-03-18 DIAGNOSIS — E559 Vitamin D deficiency, unspecified: Secondary | ICD-10-CM

## 2024-03-18 DIAGNOSIS — E782 Mixed hyperlipidemia: Secondary | ICD-10-CM | POA: Diagnosis not present

## 2024-03-18 DIAGNOSIS — E7841 Elevated Lipoprotein(a): Secondary | ICD-10-CM

## 2024-03-18 NOTE — Progress Notes (Signed)
 Cardiology Office Note:  .   Date:  03/19/2024  ID:  Fernando Nelson, DOB 23-Jan-1961, MRN [id number] PCP: Arloa Elsie SAUNDERS, MD  [facility] Providers Cardiologist:  Aleene Passe, MD (Inactive)   History of Present Illness: .   Fernando Nelson is a 63 y.o. Caucasian male patient with CAD, coronary stent in 2013, mixed hypercholesterolemia, diabetes mellitus who was previously being followed by Dr. Aleene Passe now presents to establish care with me.  Discussed the use of AI scribe software for clinical note transcription with the patient, who gave verbal consent to proceed.  History of Present Illness Fernando Nelson is a 63 year old male with coronary artery disease and diabetes who presents for a cardiovascular follow-up.  He had a stent placed in 2013 with no subsequent chest pain or cardiac symptoms. He is on Trulicity for diabetes management, with an A1c over 9%. He experiences numbness in the legs and soreness in the feet, primarily in the morning. He is approximately 60 pounds overweight and aims to lose weight. He walks three times a week with his granddaughter, covering about 5,000 steps at a slow pace. 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 He limits his intake of junk food.  Labs   Lab Results  Component Value Date   CHOL 176 03/18/2024   HDL 33 (L) 03/18/2024   LDLCALC 91 03/18/2024   LDLDIRECT 85.2 10/04/2013   TRIG 313 (H) 03/18/2024   CHOLHDL 5.3 (H) 03/18/2024    Lipoprotein (a)  Date/Time Value Ref Range Status  03/18/2024 04:36 PM 193.4 (H) <75.0 nmol/L Final    Comment:    Note:  Values greater than or equal to 75.0 nmol/L may        indicate an independent risk factor for CHD,        but must be evaluated with caution when applied        to non-Caucasian populations due to the        influence of genetic factors on Lp(a) across        ethnicities.     Lab Results  Component Value Date   NA 137 02/25/2023   K  4.5 02/25/2023   CO2 28 02/25/2023   GLUCOSE 211 (H) 02/25/2023   BUN 27 (H) 02/25/2023   CREATININE 1.00 02/25/2023   CALCIUM  8.3 (L) 02/25/2023   GFR 90.26 05/08/2012   EGFR 99 01/07/2021   GFRNONAA >60 02/25/2023      Latest Ref Rng & Units 02/25/2023    5:23 AM 02/24/2023    5:38 AM 02/23/2023    5:10 AM  BMP  Glucose 70 - 99 mg/dL 788  700  820   BUN 6 - 20 mg/dL 27  25  16    Creatinine 0.61 - 1.24 mg/dL 8.99  8.91  8.97   Sodium 135 - 145 mmol/L 137  133  139   Potassium 3.5 - 5.1 mmol/L 4.5  5.1  4.3   Chloride 98 - 111 mmol/L 101  98  100   CO2 22 - 32 mmol/L 28  26  29    Calcium  8.9 - 10.3 mg/dL 8.3  8.2  8.6       Latest Ref Rng & Units 02/25/2023    5:23 AM 02/24/2023    5:38 AM 02/23/2023    5:10 AM  CBC  WBC 4.0 - 10.5 K/uL 11.7  10.8  7.1   Hemoglobin  13.0 - 17.0 g/dL 86.9  86.7  86.2   Hematocrit 39.0 - 52.0 % 40.5  41.0  43.0   Platelets 150 - 400 K/uL 226  221  192    Lab Results  Component Value Date   HGBA1C 9.9 (H) 02/23/2023    Lab Results  Component Value Date   TSH 4.280 10/07/2020    External Labs:  Care everywhere labs 02/27/2024:  TSH normal at 2.12. A1c 9.2%.  Serum glucose 67 mg, BUN 20, creatinine 0.98, EGFR 87 mL, potassium 4.8, LFTs normal.  LDL direct 133.  HDL 34.  Labs 06/14/2023:  Vitamin D  12.2.  Lipid profile 06/13/2023:  TC 104, TG 222, HDL 26, LDL 42  ROS  Review of Systems  Cardiovascular:  Negative for chest pain, dyspnea on exertion and leg swelling.   Physical Exam:   VS:  BP 135/78 (BP Location: Left Arm, Patient Position: Sitting, Cuff Size: Large)   Pulse 90   Resp 16   Ht 5' 7 (1.702 m)   Wt 249 lb 3.2 oz (113 kg)   SpO2 95%   BMI 39.03 kg/m    Wt Readings from Last 3 Encounters:  03/18/24 249 lb 3.2 oz (113 kg)  02/26/23 252 lb 10.4 oz (114.6 kg)  02/21/23 254 lb 6.4 oz (115.4 kg)    Physical Exam Constitutional:      Appearance: He is obese.  Neck:     Vascular: No carotid bruit or JVD.   Cardiovascular:     Rate and Rhythm: Normal rate and regular rhythm.     Pulses: Intact distal pulses.     Heart sounds: Normal heart sounds. No murmur heard.    No gallop.  Pulmonary:     Effort: Pulmonary effort is normal.     Breath sounds: Normal breath sounds.  Abdominal:     General: Bowel sounds are normal.     Palpations: Abdomen is soft.  Musculoskeletal:     Right lower leg: No edema.     Left lower leg: No edema.    Studies Reviewed: SABRA    Coronary angiogram 03/23/2012: Successful PTCA and stenting of RCA with 3.5 EVOLVE II study stent by 20mm EKG:    EKG Interpretation Date/Time:  Monday March 18 2024 15:33:07 EDT Ventricular Rate:  87 PR Interval:  182 QRS Duration:  88 QT Interval:  354 QTC Calculation: 425 R Axis:   62  Text Interpretation: EKG 03/18/2024: Normal sinus rhythm at rate of 87 bpm, normal EKG.  Compared to 02/22/2023, no significant change. Confirmed by Akire Rennert, Jagadeesh ([id number]) on 03/18/2024 3:50:33 PM    Medications ordered    Meds ordered this encounter  Medications   ezetimibe  (ZETIA ) 10 MG tablet    Sig: Take 1 tablet (10 mg total) by mouth daily.    Dispense:  90 tablet    Refill:  3     ASSESSMENT AND PLAN: .      ICD-10-CM   1. Coronary artery disease involving native coronary artery of native heart without angina pectoris  I25.10 EKG 12-Lead    Lipoprotein A (LPA)    Lipid panel    2. Type 2 diabetes mellitus with hyperglycemia, without long-term current use of insulin  (HCC)  E11.65     3. Mixed hypercholesterolemia and hypertriglyceridemia  E78.2 Lipoprotein A (LPA)    Lipid panel    ezetimibe  (ZETIA ) 10 MG tablet    4. Class 2 severe obesity due to excess calories with serious  comorbidity and body mass index (BMI) of 39.0 to 39.9 in adult (HCC)  Z33.187    E66.01    Z68.39     5. Hypovitaminosis D  E55.9     6. 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 No current chest pain or cardiac symptoms. Increased cardiovascular risk due to poorly controlled diabetes and hyperlipidemia. - Emphasize lifestyle modifications to prevent future cardiac events. - Monitor for new symptoms such as shortness of breath or chest pain and report immediately.  Type 2 diabetes mellitus with poor control Type 2 diabetes mellitus with poor control, evidenced by an A1c over 9%. Experiences numbness in the legs and soreness in the feet, particularly in the morning. Current management with Trulicity is not achieving desired glycemic control. Poor control poses significant cardiovascular risk, especially with coronary artery disease. - Send a message to Dr. Braulio to consider increasing the dose of Trulicity to the maximum or switching to a different medication. - Encourage increased physical activity and dietary modifications to improve glycemic control.  Hyperlipidemia Hyperlipidemia with discrepancies in LDL levels; direct LDL of 866 mg/dL and previous calculated LDL of 42 mg/dL. True LDL level is uncertain, necessitating further evaluation. High triglycerides indicate suboptimal dietary and exercise habits. Accurate lipid levels are crucial for cardiovascular risk assessment and management. - Order repeat lipid profile to determine accurate LDL levels. - Order Lp(a) test to assess cardiovascular risk and guide treatment.  Obesity Obesity with excess weight of approximately 60 pounds. Current physical activity includes walking three times a week, but the pace and frequency may be insufficient for weight loss. Previous attempts at weight loss clinics were financially burdensome. Obesity exacerbates cardiovascular risk factors, including diabetes and hyperlipidemia. - Encourage daily physical activity, such as walking for 30 minutes every day. - Discuss with his endocrinologist the potential for increasing Trulicity dosage to aid in weight  loss.  Vitamin D  deficiency Vitamin D  deficiency managed with 50,000 units weekly. Monitoring is advised to avoid hypercalcemia from excessive supplementation. - Ensure vitamin D  levels are monitored regularly to adjust dosage as needed. Office visit in 6 months, if he remains stable on a as needed basis.  Addendum, Lipids reviewed, he should start Zetia  and goal LDL < 55. With elevated Lpa, suspect best option is probably Repatha. Will discuss on his next visit. Will have PCP do lipids in 3 months.   Signed,  Gordy Bergamo, MD, [facility] 03/19/2024, 5:51 AM [facility] [address] [place], [place] [address] Phone: [phone]. Fax:  [phone]

## 2024-03-18 NOTE — Patient Instructions (Signed)
 Medication Instructions:  Your physician recommends that you continue on your current medications as directed. Please refer to the Current Medication list given to you today.  *If you need a refill on your cardiac medications before your next appointment, please call your pharmacy*  Lab Work: Fasting Lipids and LpA - please have these drawn at any [facility] location  Follow-Up: At [facility], you and your health needs are our priority.  As part of our continuing mission to provide you with exceptional heart care, our providers are all part of one team.  This team includes your primary Cardiologist (physician) and Advanced Practice Providers or APPs (Physician Assistants and Nurse Practitioners) who all work together to provide you with the care you need, when you need it.  Your next appointment:   6 months  Provider:   Gordy Bergamo, MD

## 2024-03-19 ENCOUNTER — Ambulatory Visit: Payer: Self-pay | Admitting: Cardiology

## 2024-03-19 LAB — LIPOPROTEIN A (LPA): Lipoprotein (a): 193.4 nmol/L — ABNORMAL HIGH (ref <75.0)

## 2024-03-19 LAB — LIPID PANEL
Chol/HDL Ratio: 5.3 ratio — ABNORMAL HIGH (ref 0.0–5.0)
Cholesterol, Total: 176 mg/dL (ref 100–199)
HDL: 33 mg/dL — ABNORMAL LOW (ref >39)
LDL Chol Calc (NIH): 91 mg/dL (ref 0–99)
Triglycerides: 313 mg/dL — ABNORMAL HIGH (ref 0–149)
VLDL Cholesterol Cal: 52 mg/dL — ABNORMAL HIGH (ref 5–40)

## 2024-03-19 MED ORDER — EZETIMIBE 10 MG PO TABS: 10.0000 mg | ORAL_TABLET | Freq: Every day | ORAL | 3 refills | Status: AC

## 2024-03-19 NOTE — Addendum Note (Signed)
 Addended by: LADONA MILAN on: 03/19/2024 06:12 AM   Modules accepted: Orders

## 2024-04-15 ENCOUNTER — Other Ambulatory Visit: Payer: Self-pay

## 2024-04-15 MED ORDER — METOPROLOL TARTRATE 25 MG PO TABS: ORAL_TABLET | ORAL | 3 refills | Status: AC

## 2024-08-01 ENCOUNTER — Encounter: Payer: Self-pay | Admitting: Cardiology

## 2024-09-26 NOTE — Progress Notes (Signed)
 Labs 09/25/2024:  Total cholesterol 258, triglycerides 546, HDL 34, LDL 126.  Needs to initiate Repatha

## 2024-09-27 ENCOUNTER — Telehealth: Payer: Self-pay | Admitting: Pharmacist

## 2024-09-27 NOTE — Telephone Encounter (Signed)
 Call patient, schedule OV with me on Feb 11 at 11:30

## 2024-10-02 ENCOUNTER — Ambulatory Visit: Admitting: Pharmacist

## 2024-12-18 ENCOUNTER — Ambulatory Visit: Admitting: Cardiology
